# Patient Record
Sex: Male | Born: 1955 | Hispanic: Yes | Marital: Married | Smoking: Never smoker
Health system: Southern US, Community
[De-identification: ages and names within clinical notes are randomized; demographics above are authoritative.]

## PROBLEM LIST (undated history)

## (undated) DIAGNOSIS — H9190 Unspecified hearing loss, unspecified ear: Secondary | ICD-10-CM

## (undated) DIAGNOSIS — F1010 Alcohol abuse, uncomplicated: Secondary | ICD-10-CM

## (undated) DIAGNOSIS — R251 Tremor, unspecified: Secondary | ICD-10-CM

## (undated) HISTORY — PX: FRACTURE SURGERY: SHX138

---

## 2004-05-02 ENCOUNTER — Emergency Department: Payer: Self-pay | Admitting: Emergency Medicine

## 2013-02-17 ENCOUNTER — Inpatient Hospital Stay: Payer: Self-pay | Admitting: Orthopedic Surgery

## 2013-02-17 LAB — COMPREHENSIVE METABOLIC PANEL
Albumin: 4.6 g/dL (ref 3.4–5.0)
Alkaline Phosphatase: 99 U/L (ref 50–136)
Anion Gap: 9 (ref 7–16)
Bilirubin,Total: 1.3 mg/dL — ABNORMAL HIGH (ref 0.2–1.0)
Calcium, Total: 9.6 mg/dL (ref 8.5–10.1)
Creatinine: 0.78 mg/dL (ref 0.60–1.30)
Glucose: 160 mg/dL — ABNORMAL HIGH (ref 65–99)
Osmolality: 265 (ref 275–301)
Potassium: 3.5 mmol/L (ref 3.5–5.1)
SGOT(AST): 83 U/L — ABNORMAL HIGH (ref 15–37)
SGPT (ALT): 43 U/L (ref 12–78)
Total Protein: 8.7 g/dL — ABNORMAL HIGH (ref 6.4–8.2)

## 2013-02-17 LAB — CBC WITH DIFFERENTIAL/PLATELET
Basophil #: 0.1 10*3/uL (ref 0.0–0.1)
Basophil %: 0.7 %
Eosinophil %: 0 %
HGB: 12.1 g/dL — ABNORMAL LOW (ref 13.0–18.0)
Lymphocyte #: 0.9 10*3/uL — ABNORMAL LOW (ref 1.0–3.6)
Lymphocyte %: 9.4 %
MCH: 31.6 pg (ref 26.0–34.0)
MCHC: 34.5 g/dL (ref 32.0–36.0)
MCV: 92 fL (ref 80–100)
Monocyte %: 9.8 %
Neutrophil %: 80.1 %

## 2013-02-17 LAB — ETHANOL
Ethanol %: <0.003 % (ref 0.000–0.080)
Ethanol: <3 mg/dL

## 2013-02-17 LAB — PROTIME-INR: Prothrombin Time: 13.1 secs (ref 11.5–14.7)

## 2013-02-18 DIAGNOSIS — Z01810 Encounter for preprocedural cardiovascular examination: Secondary | ICD-10-CM

## 2013-02-18 LAB — CBC WITH DIFFERENTIAL/PLATELET
Basophil %: 0.4 %
Eosinophil #: 0 10*3/uL (ref 0.0–0.7)
Eosinophil %: 0 %
HGB: 11.3 g/dL — ABNORMAL LOW (ref 13.0–18.0)
Lymphocyte %: 9.8 %
MCH: 32.1 pg (ref 26.0–34.0)
MCHC: 34.8 g/dL (ref 32.0–36.0)
Monocyte #: 0.8 x10 3/mm (ref 0.2–1.0)
Monocyte %: 10.9 %
Neutrophil %: 78.9 %
RBC: 3.53 10*6/uL — ABNORMAL LOW (ref 4.40–5.90)
RDW: 13.9 % (ref 11.5–14.5)

## 2013-02-18 LAB — URINALYSIS, COMPLETE
Bacteria: NONE SEEN
Bilirubin,UR: NEGATIVE
Glucose,UR: 50 mg/dL (ref 0–75)
Hyaline Cast: 5
Leukocyte Esterase: NEGATIVE
Protein: 100
RBC,UR: 2 /HPF (ref 0–5)
Specific Gravity: 1.021 (ref 1.003–1.030)
Squamous Epithelial: NONE SEEN
Transitional Epi: <1
WBC UR: 4 /HPF (ref 0–5)

## 2013-02-18 LAB — BASIC METABOLIC PANEL
Anion Gap: 5 — ABNORMAL LOW (ref 7–16)
BUN: 16 mg/dL (ref 7–18)
Calcium, Total: 8.9 mg/dL (ref 8.5–10.1)
Co2: 29 mmol/L (ref 21–32)
EGFR (African American): >60
Osmolality: 269 (ref 275–301)
Potassium: 3.2 mmol/L — ABNORMAL LOW (ref 3.5–5.1)
Sodium: 133 mmol/L — ABNORMAL LOW (ref 136–145)

## 2013-02-18 LAB — MAGNESIUM: Magnesium: 1.4 mg/dL — ABNORMAL LOW

## 2013-02-19 LAB — HEMOGLOBIN: HGB: 10.1 g/dL — ABNORMAL LOW (ref 13.0–18.0)

## 2013-02-19 LAB — PLATELET COUNT: Platelet: 193 10*3/uL (ref 150–440)

## 2013-02-19 LAB — MAGNESIUM: Magnesium: 1.7 mg/dL — ABNORMAL LOW

## 2013-02-21 LAB — BASIC METABOLIC PANEL
Anion Gap: 5 — ABNORMAL LOW (ref 7–16)
BUN: 14 mg/dL (ref 7–18)
Calcium, Total: 8.6 mg/dL (ref 8.5–10.1)
Chloride: 96 mmol/L — ABNORMAL LOW (ref 98–107)
Co2: 30 mmol/L (ref 21–32)
Creatinine: 0.67 mg/dL (ref 0.60–1.30)
EGFR (African American): >60
Sodium: 131 mmol/L — ABNORMAL LOW (ref 136–145)

## 2013-07-04 ENCOUNTER — Emergency Department: Payer: Self-pay | Admitting: Emergency Medicine

## 2014-07-03 ENCOUNTER — Emergency Department
Admit: 2014-07-03 | Disposition: A | Discharge status: Home / Self Care | Payer: Self-pay | Admitting: Emergency Medicine

## 2014-07-03 LAB — COMPREHENSIVE METABOLIC PANEL
ALK PHOS: 97 U/L
ALT: 13 U/L — AB
Albumin: 4.7 g/dL
Anion Gap: 9 (ref 7–16)
BUN: 9 mg/dL
Bilirubin,Total: 0.6 mg/dL
CALCIUM: 9.7 mg/dL
Chloride: 97 mmol/L — ABNORMAL LOW
Co2: 29 mmol/L
Creatinine: 0.67 mg/dL
EGFR (African American): >60
EGFR (Non-African Amer.): >60
Glucose: 220 mg/dL — ABNORMAL HIGH
POTASSIUM: 4 mmol/L
SGOT(AST): 20 U/L
Sodium: 135 mmol/L
Total Protein: 8.2 g/dL — ABNORMAL HIGH

## 2014-07-03 LAB — CBC WITH DIFFERENTIAL/PLATELET
Basophil #: 0.1 10*3/uL (ref 0.0–0.1)
Basophil %: 0.7 %
EOS ABS: 0.1 10*3/uL (ref 0.0–0.7)
Eosinophil %: 1.3 %
HCT: 39.8 % — ABNORMAL LOW (ref 40.0–52.0)
HGB: 13.4 g/dL (ref 13.0–18.0)
LYMPHS PCT: 33.4 %
Lymphocyte #: 2.3 10*3/uL (ref 1.0–3.6)
MCH: 28.6 pg (ref 26.0–34.0)
MCHC: 33.6 g/dL (ref 32.0–36.0)
MCV: 85 fL (ref 80–100)
MONOS PCT: 6.7 %
Monocyte #: 0.5 x10 3/mm (ref 0.2–1.0)
NEUTROS PCT: 57.9 %
Neutrophil #: 4.1 10*3/uL (ref 1.4–6.5)
Platelet: 321 10*3/uL (ref 150–440)
RBC: 4.68 10*6/uL (ref 4.40–5.90)
RDW: 14.5 % (ref 11.5–14.5)
WBC: 7 10*3/uL (ref 3.8–10.6)

## 2014-07-03 LAB — URINALYSIS, COMPLETE
BACTERIA: NONE SEEN
BILIRUBIN, UR: NEGATIVE
Blood: NEGATIVE
Hyaline Cast: 2
KETONE: NEGATIVE
LEUKOCYTE ESTERASE: NEGATIVE
Nitrite: NEGATIVE
Ph: 6 (ref 4.5–8.0)
Protein: NEGATIVE
RBC,UR: NONE SEEN /HPF (ref 0–5)
SQUAMOUS EPITHELIAL: NONE SEEN
Specific Gravity: 1.007 (ref 1.003–1.030)
WBC UR: 3 /HPF (ref 0–5)

## 2014-07-03 LAB — TROPONIN I: Troponin-I: <0.03 ng/mL

## 2014-07-22 NOTE — H&P (Signed)
PATIENT NAME:  Geoffrey HickmanELLEZ, Mrk MR#:  [id number] DATE OF BIRTH:  07/11/1960  DATE OF ADMISSION:  02/17/2013  CHIEF COMPLAINT: Left hip pain and inability to walk.   HISTORY OF PRESENT ILLNESS: The patient is a 59 year old, who was intoxicated at his home the day prior to admission. He fell down the stairs and was unable to bear weight. He came into the Emergency Room with family on the day of admission. X-rays were obtained that showed a completely displaced femoral neck fracture and he is being admitted for treatment of this.   PAST MEDICAL HISTORY: Remarkable for no known drug allergies. No chronic medications. No prior surgeries.   SOCIAL HISTORY: He is a heavy drinker of 6 to 12 beers, probably daily according to his wife. He works as a Administratorlandscaper, currently unemployed and does not speak English. He is seen with the aid of an interpreter.   REVIEW OF SYSTEMS: Positive just for severe left hip pain. Denies prodromal symptoms.   PHYSICAL EXAMINATION:  GENERAL: Well-developed, well-nourished male, who appears his stated age, in a great deal of distress secondary to left hip pain.  HEENT: Remarkable for missing a few teeth, but otherwise intact. Normocephalic, atraumatic.  LUNGS: Clear.  HEART: Regular rate and rhythm.  ABDOMEN: Soft, nontender.  EXTREMITIES: Remarkable for holding the hip in a flexed position with external rotation. He has strong dorsalis pedis and posterior tib pulses. Skin is intact around the hip. There is no ecchymosis.   X-rays reveal a completely displaced femoral neck fracture.   CLINICAL IMPRESSION: Completely displaced femoral neck fracture in a young, active male.   RECOMMENDATION: Total hip replacement. I think endoprosthesis is likely to give significant groin pain with his physical demands, I discussed anterior approach and possibility of some lateral femoral cutaneous numbness, infection and blood clot in particular and the need to protect the hip postoperatively.  Medical consult was placed to help with his potential alcohol withdrawal. Plan on surgery later today. Again, the patient seen with interpreter including discussion of surgical procedure.   ____________________________ Leitha SchullerMichael J. Andrei Mccook, MD mjm:aw D: 02/18/2013 07:39:59 ET T: 02/18/2013 07:51:27 ET JOB#: [id number]  cc: Leitha SchullerMichael J. Manette Doto, MD, <Dictator> Leitha SchullerMICHAEL J Ameli Sangiovanni MD ELECTRONICALLY SIGNED 02/18/2013 10:23

## 2014-07-22 NOTE — Consult Note (Signed)
PATIENT NAME:  Geoffrey Solis, Geoffrey Solis MR#:  [id number] DATE OF BIRTH:  07/11/1960  DATE OF CONSULTATION:  02/18/2013  REFERRING PHYSICIAN:  Dr. Rosita KeaMenz.  CONSULTING PHYSICIAN:  Krystal EatonShayiq Sandra Tellefsen, MD  PRIMARY CARE PHYSICIAN: None.   REASON FOR CONSULTATION: Help with medical management and preop evaluation.   HISTORY OF PRESENT ILLNESS: The patient is a pleasant, 59 year old, Hispanic male with no significant past medical history, on no medications. Per him, he drinks about 6 to 12 cans of beer on the weekends but per interpreter who is interpreting for me who had also discussed with the wife, she had told the interpreter previously that the patient is a daily drinker. Apparently, the patient had been drinking and was going down in the basement while intoxicated and fell down about 10 steps and had hip pain. Was noted to have a hip fracture on the left. The patient is currently admitted to orthopedics, and medicine was consulted for preop evaluation and medical management.   The patient states that he is usually active. He works in [employer] but about 3 weeks ago, his job stopped. He does not have any history of MI or CHF. He has no chest pain while exerting himself.   PAST MEDICAL HISTORY: Denies.   SURGERIES: Denies.   ALLERGIES: Denies.   OUTPATIENT MEDICATIONS: Denies.   FAMILY HISTORY: Denies any history of CHF, MI, CAD or diabetes.   SOCIAL HISTORY: Positive for alcohol as above. Denied smoking. Denies any other drug use. Profession is Aeronautical engineerlandscaping.   REVIEW OF SYSTEMS:   CONSTITUTIONAL: Positive for about 11-pound weight loss unintentional. No fevers, chills, night sweats. Decreased vision for about 10 years.  ENT: No tinnitus, hearing loss or sore throat.  CARDIOVASCULAR: No chest pain, palpitations, MI or history of CHF.   RESPIRATORY: No cough, wheezing, shortness of breath or dyspnea on exertion.  GASTROINTESTINAL: No nausea, vomiting, diarrhea, abdominal pain, black stools or  dark stools or bloody stools.  GENITOURINARY: Denies dysuria, hematuria.  HEMATOLOGIC AND LYMPHATIC: Denies anemia or easy bruising.  SKIN: No rashes.  MUSCULOSKELETAL: Has hip pain.  PSYCHIATRIC: No anxiety or depression.  ENDOCRINE: Denies polyuria, nocturia or thyroid problems.  NEUROLOGIC: Denies any stroke, seizures or headaches.   PHYSICAL EXAMINATION:  VITAL SIGNS: Temperature on arrival noted to be 100.1, pulse rate 102, respiratory rate 18, blood pressure 157/82. O2 sat 95% on room air.  GENERAL: The patient is a well-developed male sitting in bed, no obvious distress, talking in full sentences.  HEENT: Normocephalic, atraumatic. Pupils appear to be equal and reactive. Extraocular muscles intact. Moist mucous membranes.  NECK: Supple. No thyroid tenderness. No cervical lymphadenopathy.  CARDIOVASCULAR: S1, S2, tachycardic. No significant murmurs, rubs or gallops.  LUNGS: Clear to auscultation without wheezing, rhonchi or rales.  ABDOMEN: Soft, nontender, nondistended. Positive bowel sounds in all quadrants.  EXTREMITIES: No pitting edema.  NEUROLOGIC: Cranial nerves II through XII grossly intact. Strength is 5 out of 5 upper extremities. Did not test lower extremities secondary to elicitation of pain with movement of the left lower extremity. The patient is slightly tremulous.  PSYCHIATRIC: The patient is awake, alert, oriented x 2 to 3. The patient knows the date. Oriented to place, person and almost date except that he thinks this is 2015.  SKIN: Multiple tattoos without any obvious rashes.   LABORATORIES: Glucose 160, BUN 20, creatinine 0.78, sodium 129, potassium 3.5. Alcohol below detection. Bilirubin 1.3, AST 83, ALT 43. White count 9.5, hemoglobin 12.1, platelets are 253. INR is  1. EKG shows normal sinus rhythm, rate of 96. No acute ST changes. Incomplete right bundle. X-ray of left hip complete showing moderately displaced left femoral neck fracture, and x-ray of the chest, 1  view, showing tortuous thoracic aorta and thoracic spondylosis. Lungs clear.   ASSESSMENT AND PLAN: A 59 year old Hispanic male with no significant past medical history, on no medications who is admitted to orthopedics and medicine was consulted for preoperative evaluation. 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 He has greater than 4 metabolic equivalents and I think is an acceptable risk for the operating room. We may proceed to operating room without further cardiac testing. He is not on a beta blocker. In regards to his alcohol history, I would agree with [facility]drawal [facility] for Alcohol protocol. He is tremulous but denies having any delirium tremens or withdrawals in the past. He appears to be at moderate to high risk for having some withdrawal symptoms. Postoperatively, I would consider standing doses of Librium if possible in addition to the [facility]al [facility] for Alcohol protocol. Would go ahead and check magnesium. He is on a banana bag already. This will continue. Pain and deep vein thrombosis prophylaxis per orthopedics. I would add a magnesium to the morning laboratories. He has slight hyponatremia. He has received some normal saline and will go ahead and recheck another BMP in the morning.   Thank you for this kind consult and will follow along with you.   TOTAL TIME SPENT: 45 minutes.   ____________________________ Krystal Eaton, MD sa:gb D: 02/18/2013 03:26:00 ET T: 02/18/2013 03:37:47 ET JOB#: [id number]  cc: Krystal Eaton, MD, <Dictator> Krystal Eaton MD ELECTRONICALLY SIGNED 03/08/2013 20:00

## 2014-07-22 NOTE — Consult Note (Signed)
Brief Consult Note: Diagnosis: hip fx.   Patient was seen by consultant.   Consult note dictated.   Recommend to proceed with surgery or procedure.   Recommend further assessment or treatment.   Orders entered.   Discussed with Attending MD.   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 drinks per him on weekends 6-12beers daily but per wife daily. no hx of dt, withdrawls per pt. s/p fall down steps while intoxicated and had hip fx.  admited by ortho.  no hx of chf,mi. no cp. active at baseline w/t angina. METS>4. proceed to OR w/t further cardiac testing and pt with acceptable risk. agree with ciwa. some tremors.  moniter.  check mag.  once able to take po consider standing tapered librium. check mag.  Electronic Signatures: Krystal EatonAhmadzia, Parish Dubose (MD)  (Signed [phone]-Nov-14 03:17)  Authored: Brief Consult Note   Last Updated: 20-Nov-14 03:17 by Krystal EatonAhmadzia, Dea Bitting (MD)

## 2014-07-22 NOTE — Discharge Summary (Signed)
PATIENT NAME:  Geoffrey Solis, Geoffrey Solis MR#:  [id number] DATE OF BIRTH:  07/11/1960  DATE OF ADMISSION:  02/17/2013 DATE OF DISCHARGE:  02/21/2013.   ADMITTING DIAGNOSIS: Left hip femoral neck fracture.   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   PROCEDURE: Left total hip replacement.   ANESTHESIA: General.   SURGEON: Leitha Schuller, M.D.   ESTIMATED BLOOD LOSS: 250 mL.   COMPLICATIONS: None.   SPECIMEN: Femoral head and neck.  IMPLANTS: Medacta AMIStem size 3 Versafit cup, DM 56 mm with appropriate liner and then S 28 mm head.   CONDITION: To recovery room, stable.   HISTORY: The patient is a 59 year old male who was intoxicated at his home on the day of admission. He fell down the stairs and was unable to unable to bear weight. He came to the Emergency Room with family on the day of admission. X-rays were obtained that showed a completely displaced femoral neck fracture, and he is being admitted for treatment of this.   PHYSICAL EXAMINATION: GENERAL: Well-developed, well-nourished male who appears his stated age, in a great deal of distress secondary to left hip pain.  HEENT: Remarkable for missing a few teeth but otherwise intact. Normocephalic, atraumatic.  LUNGS: Clear.  HEART: Regular rate and rhythm.  ABDOMEN: Soft, nontender.  EXTREMITIES: Remarkable for allowing the hip in a flexed position with external rotation. He has strong dorsalis pedis and posterior tibialis pulses. Skin is intact around the hip. There is no ecchymosis.   IMAGING: X-rays reveal completely displaced femoral neck fracture.   HOSPITAL COURSE: The patient was admitted to the hospital on 02/17/2013. He was admitted by orthopedics, and medicine was consulted for surgical clearance. The patient was found to be at low risk. He was placed on a CIWA protocol for his alcoholism. He was found to be hyponatremic at 129, and he was started on normal saline. On 02/18/2013, the patient had  surgery that day. He was brought to the orthopedic floor from the PACU in stable condition. The potassium and magnesium were found to be low, with potassium 3.2 and magnesium of 1.4. These were repleted. His sodium trended up to 133 after normal saline. The patient was found to be hypertensive so he was started on a beta blocker. On 02/19/2013, postoperative day 1, the patient's magnesium was 1.7 and potassium was 3.5. The patient did have a small drop in hemoglobin of 10.1. The patient progressed well with therapy. On postoperative day 2, the patient continued to progress very well with physical therapy. His vital signs and labs were stable. He ambulated 380 feet with a walker. He was doing well. On postoperative day 3, 02/21/2013, the patient continued to progress well with physical therapy. He was doing well, pain was controlled, and he was ready for discharge to home with home health PT.   CONDITION AT DISCHARGE: Stable.   DISCHARGE INSTRUCTIONS: The patient may gradually increase weight-bearing on the affected extremity. Thigh-high TED hose on both legs are removed at bedtime and replaced when arising the next morning. Elevate the heels off the bed. Use incentive spirometry every hour while awake. Encourage cough and deep breathing. He may resume a regular diet as tolerated. Apply an ice pack to the affected area. Do not get the dressing or bandage wet or dirty. Call [facility] if the dressing gets water under it. Leave the dressing on. Call [facility] if any of the following occur: Bright red bleeding from the incision wound, fever above 101.5  degrees, redness, swelling, or drainage at the incision site. Call [facility] if you experience any increased leg pain, numbness, weakness in your legs or bowel or bladder symptoms. Home physical therapy has been arranged for continuation of rehab program. Please call [facility] if a therapist has not contacted you within 48 hours of your return home.  Call [facility] for a follow-up appointment at [phone]. You need to be seen by [facility] in two weeks.   DISCHARGE MEDICATIONS: 1.  Oxycodone 5 mg 1 tablet orally every four hours as needed for pain. 2.  Vicodin 5/325, 1 tablet orally every six hours as needed for pain.  3.  Magnesium hydroxide 8% oral suspension 30 mL orally 2 times a day as needed for constipation. 4.  aspirin 325 mg 1 tab orally once a day  5.  Multivitamin 1 tablet orally once a day. 6.  Docusate/senna 50 mg/8.6 mg oral tablet 1 tablet orally 2 times a day.     ____________________________ T. Cranston Neighborhris Gaines, PA-C tcg:cg D: 02/20/2013 22:50:44 ET T: 02/20/2013 23:25:24 ET JOB#: [id number]  cc: T. Cranston Neighborhris Gaines, PA-C, <Dictator> Evon SlackHOMAS C GAINES GeorgiaPA ELECTRONICALLY SIGNED 02/22/2013 8:22

## 2014-07-22 NOTE — Op Note (Signed)
PATIENT NAME:  Geoffrey HickmanELLEZ, Abu MR#:  [id number] DATE OF BIRTH:  07/11/1960  DATE OF PROCEDURE:  02/18/2013  PREOPERATIVE DIAGNOSIS:  Left hip femoral neck fracture completely displaced.   POSTOPERATIVE DIAGNOSIS:  Left hip femoral neck fracture completely displaced with osteoarthritis.   PROCEDURE:  Left total hip replacement.   ANESTHESIA:  General.   SURGEON:  Leitha SchullerMichael J. Larrissa Stivers, MD   DESCRIPTION OF PROCEDURE:  The patient was brought to the operating room, and after adequate general anesthesia was obtained, he was placed on the operative table with the left leg in the Medacta attachment. The right leg was visualized and preop x-ray taken to restore anatomy to the left hip. After prepping and draping in the usual sterile fashion, appropriate patient identification, timeout procedures were completed. Direct anterior approach was made with incision centered over the greater trochanter and the tensor fascia lata muscle. The incision was carried down through the skin and subcutaneous tissue. The tensor fascia was incised and the muscle retracted laterally. The rectus fascia was then incised and the lateral femoral circumflex vessel cauterized. The anterior capsule was then exposed and a flap created. A femoral neck cut was carried out, removing the residual neck, and then the head was removed. There was significant bruising to the articular cartilage with some wear present. The labrum was excised and sequential reaming carried out to 56 mm. A 56 mm trial fit well and the 56 mm cup was impacted into place with fluoroscopic guidance. A Versafitcup DM 56 cup was placed. The leg was then externally rotated, pubofemoral and ischiofemoral releases carried out, and the leg brought into extension and adduction. Box osteotome followed by sequential broaching was performed. Trialing off the #2 showed that the stem could be slightly larger, and a 3 stem was placed with an S head. This restored anatomic length. Final  components were assembled with the #3 stem impacted down the canal. The bipolar head assembled with the appropriate liner for the 56 DM Versafitcup and an S-28 mm head. This was impacted onto the stem. The acetabulum was checked and was free of any debris. The hip was reduced and was stable. The wound was thoroughly irrigated and then closed with a heavy quill suture for the deep fascia. Then, 30 mL of 0.25% Sensorcaine with epinephrine was infiltrated in the area of soft tissue for postoperative analgesia. Subcutaneous Hemovac drain was placed followed by 2-0 quill subcutaneously and skin staples.   ESTIMATED BLOOD LOSS:  250.   COMPLICATIONS:  None.   SPECIMENS:  Femoral head and neck.   IMPLANTS:  Medacta AMIStem size 3, Versafitcup DM 56 mm with appropriate liner, and an S-28 mm head.   CONDITION TO RECOVERY ROOM:  Stable.    ____________________________ Leitha SchullerMichael J. Reaghan Kawa, MD mjm:ms D: 02/18/2013 17:32:52 ET T: 02/18/2013 22:20:13 ET JOB#: [id number]  cc: Leitha SchullerMichael J. Janicia Monterrosa, MD, <Dictator> Leitha SchullerMICHAEL J Jael Waldorf MD ELECTRONICALLY SIGNED 02/19/2013 13:04

## 2014-07-22 NOTE — Consult Note (Signed)
PATIENT NAME:  Geoffrey Solis, Mats MR#:  [id number] DATE OF BIRTH:  07/11/1960  DATE OF CONSULTATION:  02/17/2013  REFERRING PHYSICIAN:  Dr. Rosita KeaMenz.  CONSULTING PHYSICIAN:  Krystal EatonShayiq Sadye Kiernan, MD  PRIMARY CARE PHYSICIAN: None.   REASON FOR CONSULTATION: Help with medical management and preop evaluation.   HISTORY OF PRESENT ILLNESS: The patient is a pleasant, 59 year old, Hispanic male with no significant past medical history, on no medications. Per him, he drinks about 6 to 12 cans of beer on the weekends but per interpreter who is interpreting for me who had also discussed with the wife, she had told the interpreter previously that the patient is a daily drinker. Apparently, the patient had been drinking and was going down in the basement while intoxicated and fell down about 10 steps and had hip pain. Was noted to have a hip fracture on the left. The patient is currently admitted to orthopedics, and medicine was consulted for preop evaluation and medical management.   The patient states that he is usually active. He works in [employer] business but about 3 weeks ago, his job stopped. He does not have any history of MI or CHF. He has no chest pain while exerting himself.   PAST MEDICAL HISTORY: Denies.   SURGERIES: Denies.   ALLERGIES: Denies.   OUTPATIENT MEDICATIONS: Denies.   FAMILY HISTORY: Denies any history of CHF, MI, CAD or diabetes.   SOCIAL HISTORY: Positive for alcohol as above. Denied smoking. Denies any other drug use. Profession is Aeronautical engineerlandscaping.   REVIEW OF SYSTEMS:   CONSTITUTIONAL: Positive for about 11-pound weight loss unintentional. No fevers, chills, night sweats. Decreased vision for about 10 years.  ENT: No tinnitus, hearing loss or sore throat.  CARDIOVASCULAR: No chest pain, palpitations, MI or history of CHF.   RESPIRATORY: No cough, wheezing, shortness of breath or dyspnea on exertion.  GASTROINTESTINAL: No nausea, vomiting, diarrhea, abdominal pain, black stools or  dark stools or bloody stools.  GENITOURINARY: Denies dysuria, hematuria.  HEMATOLOGIC AND LYMPHATIC: Denies anemia or easy bruising.  SKIN: No rashes.  MUSCULOSKELETAL: Has hip pain.  PSYCHIATRIC: No anxiety or depression.  ENDOCRINE: Denies polyuria, nocturia or thyroid problems.  NEUROLOGIC: Denies any stroke, seizures or headaches.   PHYSICAL EXAMINATION:  VITAL SIGNS: Temperature on arrival noted to be 100.1, pulse rate 102, respiratory rate 18, blood pressure 157/82. O2 sat 95% on room air.  GENERAL: The patient is a well-developed male sitting in bed, no obvious distress, talking in full sentences.  HEENT: Normocephalic, atraumatic. Pupils appear to be equal and reactive. Extraocular muscles intact. Moist mucous membranes.  NECK: Supple. No thyroid tenderness. No cervical lymphadenopathy.  CARDIOVASCULAR: S1, S2, tachycardic. No significant murmurs, rubs or gallops.  LUNGS: Clear to auscultation without wheezing, rhonchi or rales.  ABDOMEN: Soft, nontender, nondistended. Positive bowel sounds in all quadrants.  EXTREMITIES: No pitting edema.  NEUROLOGIC: Cranial nerves II through XII grossly intact. Strength is 5 out of 5 upper extremities. Did not test lower extremities secondary to elicitation of pain with movement of the left lower extremity. The patient is slightly tremulous.  PSYCHIATRIC: The patient is awake, alert, oriented x 2 to 3. The patient knows the date. Oriented to place, person and almost date except that he thinks this is 2015.  SKIN: Multiple tattoos without any obvious rashes.   LABORATORIES: Glucose 160, BUN 20, creatinine 0.78, sodium 129, potassium 3.5. Alcohol below detection. Bilirubin 1.3, AST 83, ALT 43. White count 9.5, hemoglobin 12.1, platelets are 253. INR is  1. EKG shows normal sinus rhythm, rate of 96. No acute ST changes. Incomplete right bundle. X-ray of left hip complete showing moderately displaced left femoral neck fracture, and x-ray of the chest, 1  view, showing tortuous thoracic aorta and thoracic spondylosis. Lungs clear.   ASSESSMENT AND PLAN: A 59 year old Hispanic male with no significant past medical history, on no medications who is admitted to orthopedics and medicine was consulted for preoperative evaluation. 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 He has greater than 4 metabolic equivalents and I think is an acceptable risk for the operating room. We may proceed to operating room without further cardiac testing. He is not on a beta blocker. In regards to his alcohol history, I would agree with [facility] Withdrawal Assessment for Alcohol protocol. He is tremulous but denies having any delirium tremens or withdrawals in the past. He appears to be at moderate to high risk for having some withdrawal symptoms. Postoperatively, I would consider standing doses of Librium if possible in addition to the [facility] Withdrawal Assessment for Alcohol protocol. Would go ahead and check magnesium. He is on a banana bag already. This will continue. Pain and deep vein thrombosis prophylaxis per orthopedics. I would add a magnesium to the morning laboratories. He has slight hyponatremia. He has received some normal saline and will go ahead and recheck another BMP in the morning.   Thank you for this kind consult and will follow along with you.   TOTAL TIME SPENT: 45 minutes.   ____________________________ Krystal Eaton, MD sa:gb D: 02/18/2013 03:26:39 ET T: 02/18/2013 03:37:47 ET JOB#: [id number]  cc: Krystal Eaton, MD, <Dictator>

## 2016-09-20 ENCOUNTER — Emergency Department
Admission: EM | Admit: 2016-09-20 | Discharge: 2016-09-20 | Disposition: A | Discharge status: Home / Self Care | Payer: Self-pay | Attending: Emergency Medicine | Admitting: Emergency Medicine

## 2016-09-20 ENCOUNTER — Emergency Department: Payer: Self-pay

## 2016-09-20 DIAGNOSIS — E871 Hypo-osmolality and hyponatremia: Secondary | ICD-10-CM | POA: Insufficient documentation

## 2016-09-20 DIAGNOSIS — E1165 Type 2 diabetes mellitus with hyperglycemia: Secondary | ICD-10-CM | POA: Insufficient documentation

## 2016-09-20 DIAGNOSIS — E860 Dehydration: Secondary | ICD-10-CM | POA: Insufficient documentation

## 2016-09-20 LAB — URINALYSIS, COMPLETE (UACMP) WITH MICROSCOPIC
BILIRUBIN URINE: NEGATIVE
Bacteria, UA: NONE SEEN
Glucose, UA: >=500 mg/dL — AB
Hgb urine dipstick: NEGATIVE
Ketones, ur: NEGATIVE mg/dL
Leukocytes, UA: NEGATIVE
NITRITE: NEGATIVE
PH: 7 (ref 5.0–8.0)
Protein, ur: NEGATIVE mg/dL
RBC / HPF: NONE SEEN RBC/hpf (ref 0–5)
SPECIFIC GRAVITY, URINE: 1.002 — AB (ref 1.005–1.030)
Squamous Epithelial / LPF: NONE SEEN

## 2016-09-20 LAB — COMPREHENSIVE METABOLIC PANEL
ALBUMIN: 4.8 g/dL (ref 3.5–5.0)
ALK PHOS: 70 U/L (ref 38–126)
ALT: 82 U/L — ABNORMAL HIGH (ref 17–63)
AST: 95 U/L — AB (ref 15–41)
Anion gap: 12 (ref 5–15)
BUN: 26 mg/dL — AB (ref 6–20)
CALCIUM: 9.8 mg/dL (ref 8.9–10.3)
CO2: 25 mmol/L (ref 22–32)
Chloride: 88 mmol/L — ABNORMAL LOW (ref 101–111)
Creatinine, Ser: 1.19 mg/dL (ref 0.61–1.24)
GFR calc Af Amer: >60 mL/min (ref >60)
GFR calc non Af Amer: >60 mL/min (ref >60)
GLUCOSE: 305 mg/dL — AB (ref 65–99)
Potassium: 3.8 mmol/L (ref 3.5–5.1)
SODIUM: 125 mmol/L — AB (ref 135–145)
TOTAL PROTEIN: 8.1 g/dL (ref 6.5–8.1)
Total Bilirubin: 0.9 mg/dL (ref 0.3–1.2)

## 2016-09-20 LAB — CBC
HEMATOCRIT: 33.4 % — AB (ref 40.0–52.0)
HEMOGLOBIN: 11.7 g/dL — AB (ref 13.0–18.0)
MCH: 32.8 pg (ref 26.0–34.0)
MCHC: 35.2 g/dL (ref 32.0–36.0)
MCV: 93.4 fL (ref 80.0–100.0)
Platelets: 317 10*3/uL (ref 150–440)
RBC: 3.57 MIL/uL — ABNORMAL LOW (ref 4.40–5.90)
RDW: 13.8 % (ref 11.5–14.5)
WBC: 7.1 10*3/uL (ref 3.8–10.6)

## 2016-09-20 LAB — LIPASE, BLOOD: Lipase: 47 U/L (ref 11–51)

## 2016-09-20 MED ORDER — SODIUM CHLORIDE 0.9 % IV BOLUS (SEPSIS)
1000.0000 mL | Freq: Once | INTRAVENOUS | Status: AC
  Administered 2016-09-20: 1000 mL via INTRAVENOUS

## 2016-09-20 MED ORDER — FOLIC ACID 1 MG PO TABS
1.0000 mg | ORAL_TABLET | Freq: Once | ORAL | Status: AC
  Administered 2016-09-20: 1 mg via ORAL
  Filled 2016-09-20: qty 1

## 2016-09-20 MED ORDER — ONDANSETRON 4 MG PO TBDP: 4.0000 mg | ORAL_TABLET | Freq: Three times a day (TID) | ORAL | 0 refills | Status: DC | PRN

## 2016-09-20 MED ORDER — METFORMIN HCL 500 MG PO TABS: 500.0000 mg | ORAL_TABLET | Freq: Two times a day (BID) | ORAL | 0 refills | Status: DC

## 2016-09-20 MED ORDER — VITAMIN B-1 100 MG PO TABS
100.0000 mg | ORAL_TABLET | Freq: Once | ORAL | Status: AC
  Administered 2016-09-20: 100 mg via ORAL
  Filled 2016-09-20: qty 1

## 2016-09-20 MED ORDER — NAPROXEN 500 MG PO TABS
500.0000 mg | ORAL_TABLET | Freq: Once | ORAL | Status: AC
  Administered 2016-09-20: 500 mg via ORAL
  Filled 2016-09-20: qty 1

## 2016-09-20 NOTE — ED Triage Notes (Signed)
Pt states he slipped and fell 2 weeks ago and injured his left ribs, pt also c/o abd pain for several months and right sided head pain for the past 6 weeks.

## 2016-09-20 NOTE — Discharge Instructions (Signed)
Your blood sugar is significantly elevated, indicating diabetes. Take metformin twice a day as prescribed and follow-up with a primary care doctor such as the ones listed above for further monitoring of your condition. Avoid alcohol. Take ibuprofen or Aleve for your rib fracture pain.  Results for orders placed or performed during the hospital encounter of 09/20/16  Lipase, blood  Result Value Ref Range   Lipase 47 11 - 51 U/L  Comprehensive metabolic panel  Result Value Ref Range   Sodium 125 (L) 135 - 145 mmol/L   Potassium 3.8 3.5 - 5.1 mmol/L   Chloride 88 (L) 101 - 111 mmol/L   CO2 25 22 - 32 mmol/L   Glucose, Bld 305 (H) 65 - 99 mg/dL   BUN 26 (H) 6 - 20 mg/dL   Creatinine, Ser 4.541.19 0.61 - 1.24 mg/dL   Calcium 9.8 8.9 - 09.810.3 mg/dL   Total Protein 8.1 6.5 - 8.1 g/dL   Albumin 4.8 3.5 - 5.0 g/dL   AST 95 (H) 15 - 41 U/L   ALT 82 (H) 17 - 63 U/L   Alkaline Phosphatase 70 38 - 126 U/L   Total Bilirubin 0.9 0.3 - 1.2 mg/dL   GFR calc non Af Amer >60 >60 mL/min   GFR calc Af Amer >60 >60 mL/min   Anion gap 12 5 - 15  CBC  Result Value Ref Range   WBC 7.1 3.8 - 10.6 K/uL   RBC 3.57 (L) 4.40 - 5.90 MIL/uL   Hemoglobin 11.7 (L) 13.0 - 18.0 g/dL   HCT 11.933.4 (L) 14.740.0 - 82.952.0 %   MCV 93.4 80.0 - 100.0 fL   MCH 32.8 26.0 - 34.0 pg   MCHC 35.2 32.0 - 36.0 g/dL   RDW 56.213.8 13.011.5 - 86.514.5 %   Platelets 317 150 - 440 K/uL  Urinalysis, Complete w Microscopic  Result Value Ref Range   Color, Urine YELLOW (A) YELLOW   APPearance CLEAR (A) CLEAR   Specific Gravity, Urine 1.002 (L) 1.005 - 1.030   pH 7.0 5.0 - 8.0   Glucose, UA >=500 (A) NEGATIVE mg/dL   Hgb urine dipstick NEGATIVE NEGATIVE   Bilirubin Urine NEGATIVE NEGATIVE   Ketones, ur NEGATIVE NEGATIVE mg/dL   Protein, ur NEGATIVE NEGATIVE mg/dL   Nitrite NEGATIVE NEGATIVE   Leukocytes, UA NEGATIVE NEGATIVE   RBC / HPF NONE SEEN 0 - 5 RBC/hpf   WBC, UA 0-5 0 - 5 WBC/hpf   Bacteria, UA NONE SEEN NONE SEEN   Squamous Epithelial / LPF  NONE SEEN NONE SEEN   Dg Ribs Unilateral W/chest Left  Result Date: 09/20/2016 CLINICAL DATA:  Pain left anterior lower ribs. Pain marked with BB, but is in whole area around it. Fall two weeks ago, and has not gone away/ pain with deep breath. Can't sleep at night. Also having head pain as well. EXAM: LEFT RIBS AND CHEST - 3+ VIEW COMPARISON:  02/17/2013 FINDINGS: There are fractures, without significant displacement, of the anterolateral left eighth, ninth, tenth and eleventh ribs. No other fractures. The lungs are clear.  No pleural effusion or pneumothorax. Heart, mediastinum and hila are unremarkable. IMPRESSION: 1. Nondisplaced fractures of the anterolateral left eighth, ninth, tenth and eleventh ribs. No fracture complication. No acute cardiopulmonary disease. Electronically Signed   By: Amie Portlandavid  Ormond M.D.   On: 09/20/2016 17:59

## 2016-09-20 NOTE — ED Provider Notes (Signed)
[facility] Emergency Department Provider Note  ____________________________________________  Time seen: Approximately 8:26 PM  I have reviewed the triage vital signs and the nursing notes.   HISTORY  Chief Complaint Rib Injury; Headache; and Abdominal Pain  Encounter completed with Spanish interpreter at bedside  HPI Geoffrey Solis is a 61 y.o. male who complains of generalized headache, left-sided rib pain for the past 2 weeks. Triage note finds that the headache is been going on for 6 weeks. He also has chronic abdominal pain.  The rib pain is worse with movement and deep breathing. It hurts to press on that area. It started after he fell out of a chair. She reports having no medical problems and taking no medications. Denies fever chills or cough. Reports drinking daily but only 2-3 drinks a day. He says his last drink was a week ago. Denies any history of withdrawal symptoms.     History reviewed. No pertinent past medical history.   There are no active problems to display for this patient.    Past Surgical History:  Procedure Laterality Date  . FRACTURE SURGERY       Prior to Admission medications   Medication Sig Start Date End Date Taking? Authorizing Provider  metFORMIN (GLUCOPHAGE) 500 MG tablet Take 1 tablet (500 mg total) by mouth 2 (two) times daily with a meal. 09/20/16   Sharman CheekStafford, Taro Hidrogo, MD  ondansetron (ZOFRAN ODT) 4 MG disintegrating tablet Take 1 tablet (4 mg total) by mouth every 8 (eight) hours as needed for nausea or vomiting. 09/20/16   Sharman CheekStafford, Makarios Madlock, MD     Allergies Patient has no known allergies.   No family history on file.  Social History Social History  Substance Use Topics  . Smoking status: Never Smoker  . Smokeless tobacco: Never Used  . Alcohol use Yes  Daily alcohol, 3 drinks daily.  Review of Systems  Constitutional:   No fever or chills.  ENT:   No sore throat. No  rhinorrhea. Cardiovascular:   Positive left-sided chest wall pain. No syncope. Respiratory:   No dyspnea or cough. Gastrointestinal:   Positive chronic upper abdominal pain without vomiting and diarrhea.  Musculoskeletal:   Negative for focal pain or swelling All other systems reviewed and are negative except as documented above in ROS and HPI.  ____________________________________________   PHYSICAL EXAM:  VITAL SIGNS: ED Triage Vitals  Enc Vitals Group     BP 09/20/16 1658 (!) 150/95     Pulse Rate 09/20/16 1658 (!) 110     Resp 09/20/16 1658 18     Temp 09/20/16 1659 98.5 F (36.9 C)     Temp Source 09/20/16 1658 Oral     SpO2 09/20/16 1658 100 %     Weight 09/20/16 1700 146 lb (66.2 kg)     Height 09/20/16 1700 5\' 5"  (1.651 m)     Head Circumference --      Peak Flow --      Pain Score 09/20/16 1658 10     Pain Loc --      Pain Edu? --      Excl. in GC? --     Vital signs reviewed, nursing assessments reviewed.   Constitutional:   Alert and oriented. Well appearing and in no distress. Eyes:   No scleral icterus.  EOMI. No nystagmus. No conjunctival pallor. PERRL. ENT   Head:   Normocephalic and atraumatic.   Nose:   No congestion/rhinnorhea.    Mouth/Throat:  MMM, no pharyngeal erythema. No peritonsillar mass.    Neck:   No meningismus. Full ROM Hematological/Lymphatic/Immunilogical:   No cervical lymphadenopathy. Cardiovascular:   Tachycardia heart rate 110. Symmetric bilateral radial and DP pulses.  No murmurs.  Respiratory:   Normal respiratory effort without tachypnea/retractions. Breath sounds are clear and equal bilaterally. No wheezes/rales/rhonchi. Chest wall tenderness over the left inferolateral ribs consistent with nondisplaced rib fractures found on x-ray. No crepitus. No ecchymosis. Gastrointestinal:   Soft and nontender. Non distended. There is no CVA tenderness.  No rebound, rigidity, or guarding. Genitourinary:    deferred Musculoskeletal:   Normal range of motion in all extremities. No joint effusions.  No lower extremity tenderness.  No edema. Neurologic:   Normal speech and language.  Motor grossly intact. Normal gait No gross focal neurologic deficits are appreciated.  Skin:    Skin is warm, dry and intact. No rash noted.  No petechiae, purpura, or bullae.  ____________________________________________    LABS (pertinent positives/negatives) (all labs ordered are listed, but only abnormal results are displayed) Labs Reviewed  COMPREHENSIVE METABOLIC PANEL - Abnormal; Notable for the following:       Result Value   Sodium 125 (*)    Chloride 88 (*)    Glucose, Bld 305 (*)    BUN 26 (*)    AST 95 (*)    ALT 82 (*)    All other components within normal limits  CBC - Abnormal; Notable for the following:    RBC 3.57 (*)    Hemoglobin 11.7 (*)    HCT 33.4 (*)    All other components within normal limits  URINALYSIS, COMPLETE (UACMP) WITH MICROSCOPIC - Abnormal; Notable for the following:    Color, Urine YELLOW (*)    APPearance CLEAR (*)    Specific Gravity, Urine 1.002 (*)    Glucose, UA >=500 (*)    All other components within normal limits  LIPASE, BLOOD   ____________________________________________   EKG    ____________________________________________    RADIOLOGY  Dg Ribs Unilateral W/chest Left  Result Date: 09/20/2016 CLINICAL DATA:  Pain left anterior lower ribs. Pain marked with BB, but is in whole area around it. Fall two weeks ago, and has not gone away/ pain with deep breath. Can't sleep at night. Also having head pain as well. EXAM: LEFT RIBS AND CHEST - 3+ VIEW COMPARISON:  02/17/2013 FINDINGS: There are fractures, without significant displacement, of the anterolateral left eighth, ninth, tenth and eleventh ribs. No other fractures. The lungs are clear.  No pleural effusion or pneumothorax. Heart, mediastinum and hila are unremarkable. IMPRESSION: 1. Nondisplaced  fractures of the anterolateral left eighth, ninth, tenth and eleventh ribs. No fracture complication. No acute cardiopulmonary disease. Electronically Signed   By: Amie Portland M.D.   On: 09/20/2016 17:59    ____________________________________________   PROCEDURES Procedures  ____________________________________________   INITIAL IMPRESSION / ASSESSMENT AND PLAN / ED COURSE  Pertinent labs & imaging results that were available during my care of the patient were reviewed by me and considered in my medical decision making (see chart for details).  Patient presents with tachycardia hyperglycemia and mild hyponatremia, appears to be dehydrated secondary to undiagnosed diabetes and hyperglycemia. His daily alcohol intake is also contributing to dehydration. Does not appear to be in withdrawal at this time, not significantly tremulous or disoriented. Not flushed or diaphoretic or hot. Given the duration of his symptoms, a low suspicion for intracranial hemorrhage or subdural stroke meningitis encephalitis. He  is neurologically intact and no evidence of neurologic dysfunction from the hyponatremia. Patient is given IV fluids for hydration, prescription for metformin with instructions to follow up with primary care for continued management. No evidence of any acute infection or sepsis. With IV fluids tachycardia resolved. She is calm and comfortable and well-appearing.    ____________________________________________   FINAL CLINICAL IMPRESSION(S) / ED DIAGNOSES  Final diagnoses:  Type 2 diabetes mellitus with hyperglycemia, without long-term current use of insulin (HCC)  Dehydration  Hyponatremia      New Prescriptions   METFORMIN (GLUCOPHAGE) 500 MG TABLET    Take 1 tablet (500 mg total) by mouth 2 (two) times daily with a meal.   ONDANSETRON (ZOFRAN ODT) 4 MG DISINTEGRATING TABLET    Take 1 tablet (4 mg total) by mouth every 8 (eight) hours as needed for nausea or vomiting.      Portions of this note were generated with dragon dictation software. Dictation errors may occur despite best attempts at proofreading.    Sharman Cheek, MD 09/20/16 2030

## 2018-03-04 ENCOUNTER — Inpatient Hospital Stay: Payer: Self-pay

## 2018-03-04 ENCOUNTER — Emergency Department: Payer: Self-pay

## 2018-03-04 ENCOUNTER — Encounter: Payer: Self-pay | Admitting: Emergency Medicine

## 2018-03-04 ENCOUNTER — Other Ambulatory Visit: Payer: Self-pay

## 2018-03-04 ENCOUNTER — Inpatient Hospital Stay
Admission: EM | Admit: 2018-03-04 | Discharge: 2018-03-09 | DRG: 641 | Disposition: A | Discharge status: Home / Self Care | Payer: Self-pay | Attending: Internal Medicine | Admitting: Internal Medicine

## 2018-03-04 DIAGNOSIS — E876 Hypokalemia: Secondary | ICD-10-CM | POA: Diagnosis present

## 2018-03-04 DIAGNOSIS — Z7984 Long term (current) use of oral hypoglycemic drugs: Secondary | ICD-10-CM

## 2018-03-04 DIAGNOSIS — D649 Anemia, unspecified: Secondary | ICD-10-CM | POA: Diagnosis present

## 2018-03-04 DIAGNOSIS — Z23 Encounter for immunization: Secondary | ICD-10-CM

## 2018-03-04 DIAGNOSIS — D638 Anemia in other chronic diseases classified elsewhere: Secondary | ICD-10-CM | POA: Diagnosis present

## 2018-03-04 DIAGNOSIS — E871 Hypo-osmolality and hyponatremia: Principal | ICD-10-CM | POA: Diagnosis present

## 2018-03-04 DIAGNOSIS — E8729 Other acidosis: Secondary | ICD-10-CM

## 2018-03-04 DIAGNOSIS — Z96642 Presence of left artificial hip joint: Secondary | ICD-10-CM | POA: Diagnosis present

## 2018-03-04 DIAGNOSIS — M6282 Rhabdomyolysis: Secondary | ICD-10-CM | POA: Diagnosis present

## 2018-03-04 DIAGNOSIS — Z79899 Other long term (current) drug therapy: Secondary | ICD-10-CM

## 2018-03-04 DIAGNOSIS — F10231 Alcohol dependence with withdrawal delirium: Secondary | ICD-10-CM | POA: Diagnosis present

## 2018-03-04 DIAGNOSIS — W1830XA Fall on same level, unspecified, initial encounter: Secondary | ICD-10-CM | POA: Diagnosis present

## 2018-03-04 DIAGNOSIS — E119 Type 2 diabetes mellitus without complications: Secondary | ICD-10-CM | POA: Diagnosis present

## 2018-03-04 DIAGNOSIS — Z781 Physical restraint status: Secondary | ICD-10-CM

## 2018-03-04 DIAGNOSIS — R109 Unspecified abdominal pain: Secondary | ICD-10-CM

## 2018-03-04 DIAGNOSIS — E872 Acidosis: Secondary | ICD-10-CM | POA: Diagnosis present

## 2018-03-04 DIAGNOSIS — Z4659 Encounter for fitting and adjustment of other gastrointestinal appliance and device: Secondary | ICD-10-CM

## 2018-03-04 DIAGNOSIS — Z7141 Alcohol abuse counseling and surveillance of alcoholic: Secondary | ICD-10-CM

## 2018-03-04 LAB — COMPREHENSIVE METABOLIC PANEL
ALBUMIN: 4.6 g/dL (ref 3.5–5.0)
ALT: 44 U/L (ref 0–44)
AST: 89 U/L — AB (ref 15–41)
Alkaline Phosphatase: 58 U/L (ref 38–126)
Anion gap: 21 — ABNORMAL HIGH (ref 5–15)
BILIRUBIN TOTAL: 2.2 mg/dL — AB (ref 0.3–1.2)
BUN: 14 mg/dL (ref 8–23)
CALCIUM: 8.9 mg/dL (ref 8.9–10.3)
CO2: 18 mmol/L — ABNORMAL LOW (ref 22–32)
Chloride: 74 mmol/L — ABNORMAL LOW (ref 98–111)
Creatinine, Ser: 1.11 mg/dL (ref 0.61–1.24)
GFR calc Af Amer: >60 mL/min (ref >60)
GFR calc non Af Amer: >60 mL/min (ref >60)
GLUCOSE: 133 mg/dL — AB (ref 70–99)
Potassium: 3.4 mmol/L — ABNORMAL LOW (ref 3.5–5.1)
Sodium: 113 mmol/L — CL (ref 135–145)
TOTAL PROTEIN: 7.5 g/dL (ref 6.5–8.1)

## 2018-03-04 LAB — AMMONIA: Ammonia: 34 umol/L (ref 9–35)

## 2018-03-04 LAB — SODIUM
Sodium: 116 mmol/L — CL (ref 135–145)
Sodium: 121 mmol/L — ABNORMAL LOW (ref 135–145)
Sodium: 122 mmol/L — ABNORMAL LOW (ref 135–145)

## 2018-03-04 LAB — CBC WITH DIFFERENTIAL/PLATELET
ABS IMMATURE GRANULOCYTES: 0.14 10*3/uL — AB (ref 0.00–0.07)
BASOS ABS: 0 10*3/uL (ref 0.0–0.1)
Basophils Relative: 0 %
Eosinophils Absolute: 0 10*3/uL (ref 0.0–0.5)
Eosinophils Relative: 0 %
HEMATOCRIT: 30.1 % — AB (ref 39.0–52.0)
Hemoglobin: 11.1 g/dL — ABNORMAL LOW (ref 13.0–17.0)
IMMATURE GRANULOCYTES: 1 %
LYMPHS ABS: 0.9 10*3/uL (ref 0.7–4.0)
LYMPHS PCT: 7 %
MCH: 32 pg (ref 26.0–34.0)
MCHC: 36.9 g/dL — ABNORMAL HIGH (ref 30.0–36.0)
MCV: 86.7 fL (ref 80.0–100.0)
Monocytes Absolute: 0.8 10*3/uL (ref 0.1–1.0)
Monocytes Relative: 6 %
NEUTROS PCT: 86 %
NRBC: 0 % (ref 0.0–0.2)
Neutro Abs: 10.9 10*3/uL — ABNORMAL HIGH (ref 1.7–7.7)
Platelets: 284 10*3/uL (ref 150–400)
RBC: 3.47 MIL/uL — ABNORMAL LOW (ref 4.22–5.81)
RDW: 11.2 % — ABNORMAL LOW (ref 11.5–15.5)
WBC: 12.9 10*3/uL — ABNORMAL HIGH (ref 4.0–10.5)

## 2018-03-04 LAB — URINALYSIS, COMPLETE (UACMP) WITH MICROSCOPIC
BACTERIA UA: NONE SEEN
Bilirubin Urine: NEGATIVE
Glucose, UA: 50 mg/dL — AB
KETONES UR: 5 mg/dL — AB
LEUKOCYTES UA: NEGATIVE
Nitrite: NEGATIVE
PH: 6 (ref 5.0–8.0)
Protein, ur: NEGATIVE mg/dL
SPECIFIC GRAVITY, URINE: 1.004 — AB (ref 1.005–1.030)
SQUAMOUS EPITHELIAL / LPF: NONE SEEN (ref 0–5)

## 2018-03-04 LAB — PROTIME-INR
INR: 0.96
PROTHROMBIN TIME: 12.7 s (ref 11.4–15.2)

## 2018-03-04 LAB — TROPONIN I: Troponin I: <0.03 ng/mL (ref <0.03)

## 2018-03-04 LAB — OSMOLALITY: Osmolality: 244 mOsm/kg — CL (ref 275–295)

## 2018-03-04 LAB — GLUCOSE, CAPILLARY
Glucose-Capillary: 129 mg/dL — ABNORMAL HIGH (ref 70–99)
Glucose-Capillary: 99 mg/dL (ref 70–99)

## 2018-03-04 LAB — BETA-HYDROXYBUTYRIC ACID: Beta-Hydroxybutyric Acid: 2.28 mmol/L — ABNORMAL HIGH (ref 0.05–0.27)

## 2018-03-04 LAB — OSMOLALITY, URINE: Osmolality, Ur: 173 mOsm/kg — ABNORMAL LOW (ref 300–900)

## 2018-03-04 LAB — SODIUM, URINE, RANDOM: Sodium, Ur: 24 mmol/L

## 2018-03-04 LAB — CK: Total CK: 2429 U/L — ABNORMAL HIGH (ref 49–397)

## 2018-03-04 LAB — TSH: TSH: 4.809 u[IU]/mL — ABNORMAL HIGH (ref 0.350–4.500)

## 2018-03-04 LAB — MRSA PCR SCREENING: MRSA by PCR: NEGATIVE

## 2018-03-04 MED ORDER — SODIUM CHLORIDE 3 % IV SOLN
INTRAVENOUS | Status: DC
  Administered 2018-03-04: 30 mL/h via INTRAVENOUS
  Filled 2018-03-04 (×2): qty 500

## 2018-03-04 MED ORDER — ACETAMINOPHEN 325 MG PO TABS
650.0000 mg | ORAL_TABLET | Freq: Four times a day (QID) | ORAL | Status: DC | PRN
  Administered 2018-03-07 (×2): 650 mg via ORAL
  Filled 2018-03-04 (×2): qty 2

## 2018-03-04 MED ORDER — INSULIN ASPART 100 UNIT/ML ~~LOC~~ SOLN
0.0000 [IU] | Freq: Three times a day (TID) | SUBCUTANEOUS | Status: DC
  Administered 2018-03-05: 9 [IU] via SUBCUTANEOUS
  Administered 2018-03-06 (×2): 2 [IU] via SUBCUTANEOUS
  Administered 2018-03-07: 12:00:00 1 [IU] via SUBCUTANEOUS
  Administered 2018-03-07 (×2): 2 [IU] via SUBCUTANEOUS
  Administered 2018-03-08: 18:00:00 1 [IU] via SUBCUTANEOUS
  Administered 2018-03-08: 2 [IU] via SUBCUTANEOUS
  Administered 2018-03-08: 09:00:00 1 [IU] via SUBCUTANEOUS
  Administered 2018-03-09 (×2): 2 [IU] via SUBCUTANEOUS
  Filled 2018-03-04 (×11): qty 1

## 2018-03-04 MED ORDER — LORAZEPAM 2 MG/ML IJ SOLN
1.0000 mg | Freq: Once | INTRAMUSCULAR | Status: DC
  Filled 2018-03-04: qty 1

## 2018-03-04 MED ORDER — LORAZEPAM 2 MG PO TABS: 0.0000 mg | ORAL_TABLET | Freq: Two times a day (BID) | ORAL | Status: DC

## 2018-03-04 MED ORDER — LORAZEPAM 2 MG/ML IJ SOLN
0.0000 mg | Freq: Four times a day (QID) | INTRAMUSCULAR | Status: DC
  Administered 2018-03-04 (×2): 2 mg via INTRAVENOUS
  Filled 2018-03-04 (×3): qty 1

## 2018-03-04 MED ORDER — ADULT MULTIVITAMIN W/MINERALS CH
1.0000 | ORAL_TABLET | Freq: Every day | ORAL | Status: DC
  Administered 2018-03-05 – 2018-03-09 (×5): 1 via ORAL
  Filled 2018-03-04 (×5): qty 1

## 2018-03-04 MED ORDER — THIAMINE HCL 100 MG/ML IJ SOLN
100.0000 mg | Freq: Every day | INTRAMUSCULAR | Status: DC
  Filled 2018-03-04 (×3): qty 1

## 2018-03-04 MED ORDER — DEXMEDETOMIDINE HCL IN NACL 400 MCG/100ML IV SOLN
0.4000 ug/kg/h | INTRAVENOUS | Status: DC
  Administered 2018-03-04: 0.4 ug/kg/h via INTRAVENOUS
  Administered 2018-03-05: 0.5 ug/kg/h via INTRAVENOUS
  Administered 2018-03-05: 1.2 ug/kg/h via INTRAVENOUS
  Filled 2018-03-04 (×2): qty 100

## 2018-03-04 MED ORDER — LORAZEPAM 1 MG PO TABS
1.0000 mg | ORAL_TABLET | Freq: Four times a day (QID) | ORAL | Status: AC | PRN
  Administered 2018-03-07: 1 mg via ORAL

## 2018-03-04 MED ORDER — LORAZEPAM 2 MG/ML IJ SOLN
1.0000 mg | Freq: Once | INTRAMUSCULAR | Status: AC
  Administered 2018-03-04: 1 mg via INTRAVENOUS
  Filled 2018-03-04: qty 1

## 2018-03-04 MED ORDER — HYDROCODONE-ACETAMINOPHEN 5-325 MG PO TABS
1.0000 | ORAL_TABLET | ORAL | Status: DC | PRN
  Administered 2018-03-06: 1 via ORAL
  Filled 2018-03-04: qty 1

## 2018-03-04 MED ORDER — POLYETHYLENE GLYCOL 3350 17 G PO PACK: 17.0000 g | PACK | Freq: Every day | ORAL | Status: DC | PRN

## 2018-03-04 MED ORDER — MORPHINE SULFATE (PF) 4 MG/ML IV SOLN
4.0000 mg | Freq: Once | INTRAVENOUS | Status: AC
  Administered 2018-03-04: 4 mg via INTRAVENOUS

## 2018-03-04 MED ORDER — LORAZEPAM 2 MG PO TABS: 0.0000 mg | ORAL_TABLET | Freq: Four times a day (QID) | ORAL | Status: DC

## 2018-03-04 MED ORDER — VITAMIN B-1 100 MG PO TABS: 100.0000 mg | ORAL_TABLET | Freq: Every day | ORAL | Status: DC

## 2018-03-04 MED ORDER — VITAMIN B-1 100 MG PO TABS
100.0000 mg | ORAL_TABLET | Freq: Every day | ORAL | Status: DC
  Administered 2018-03-05 – 2018-03-09 (×5): 100 mg via ORAL
  Filled 2018-03-04 (×5): qty 1

## 2018-03-04 MED ORDER — ONDANSETRON HCL 4 MG PO TABS: 4.0000 mg | ORAL_TABLET | Freq: Four times a day (QID) | ORAL | Status: DC | PRN

## 2018-03-04 MED ORDER — SODIUM CHLORIDE 0.9 % IV SOLN
1000.0000 mL | Freq: Once | INTRAVENOUS | Status: AC
  Administered 2018-03-04: 1000 mL via INTRAVENOUS

## 2018-03-04 MED ORDER — KCL IN DEXTROSE-NACL 20-5-0.45 MEQ/L-%-% IV SOLN
INTRAVENOUS | Status: DC
  Administered 2018-03-04: 22:00:00 via INTRAVENOUS
  Filled 2018-03-04 (×2): qty 1000

## 2018-03-04 MED ORDER — POTASSIUM CHLORIDE IN NACL 20-0.9 MEQ/L-% IV SOLN
INTRAVENOUS | Status: DC
  Administered 2018-03-04: 22:00:00 via INTRAVENOUS
  Filled 2018-03-04 (×2): qty 1000

## 2018-03-04 MED ORDER — THIAMINE HCL 100 MG/ML IJ SOLN
100.0000 mg | Freq: Every day | INTRAMUSCULAR | Status: DC
  Administered 2018-03-04: 100 mg via INTRAVENOUS
  Filled 2018-03-04: qty 2

## 2018-03-04 MED ORDER — ENOXAPARIN SODIUM 40 MG/0.4ML ~~LOC~~ SOLN
40.0000 mg | SUBCUTANEOUS | Status: DC
  Administered 2018-03-04 – 2018-03-08 (×5): 40 mg via SUBCUTANEOUS
  Filled 2018-03-04 (×5): qty 0.4

## 2018-03-04 MED ORDER — ACETAMINOPHEN 650 MG RE SUPP: 650.0000 mg | Freq: Four times a day (QID) | RECTAL | Status: DC | PRN

## 2018-03-04 MED ORDER — LORAZEPAM 2 MG/ML IJ SOLN
0.0000 mg | Freq: Two times a day (BID) | INTRAMUSCULAR | Status: DC
  Administered 2018-03-04: 1 mg via INTRAVENOUS

## 2018-03-04 MED ORDER — LORAZEPAM 2 MG/ML IJ SOLN
1.0000 mg | Freq: Four times a day (QID) | INTRAMUSCULAR | Status: AC | PRN
  Filled 2018-03-04 (×2): qty 1

## 2018-03-04 MED ORDER — FOLIC ACID 1 MG PO TABS
1.0000 mg | ORAL_TABLET | Freq: Every day | ORAL | Status: DC
  Administered 2018-03-05 – 2018-03-09 (×5): 1 mg via ORAL
  Filled 2018-03-04 (×5): qty 1

## 2018-03-04 MED ORDER — LORAZEPAM 2 MG PO TABS
0.0000 mg | ORAL_TABLET | Freq: Two times a day (BID) | ORAL | Status: DC
  Filled 2018-03-04: qty 1

## 2018-03-04 MED ORDER — SODIUM CHLORIDE 0.9 % IV SOLN
Freq: Once | INTRAVENOUS | Status: AC
  Administered 2018-03-04: 1000 mL via INTRAVENOUS

## 2018-03-04 MED ORDER — ONDANSETRON HCL 4 MG/2ML IJ SOLN: 4.0000 mg | Freq: Four times a day (QID) | INTRAMUSCULAR | Status: DC | PRN

## 2018-03-04 MED ORDER — MORPHINE SULFATE (PF) 4 MG/ML IV SOLN
INTRAVENOUS | Status: AC
  Administered 2018-03-04: 4 mg via INTRAVENOUS
  Filled 2018-03-04: qty 1

## 2018-03-04 NOTE — ED Notes (Signed)
Patient transported to CT 

## 2018-03-04 NOTE — ED Provider Notes (Addendum)
[facility] Emergency Department Provider Note       Time seen: ----------------------------------------- 12:54 PM on 03/04/2018 ----------------------------------------- Level V caveat: History/ROS limited by altered mental status  I have reviewed the triage vital signs and the nursing notes.  HISTORY   Chief Complaint Shaking and Weakness   HPI Geoffrey Solis is a 62 y.o. male with no known past medical history who presents to the ED for weakness.  Patient was found on the floor in the basement by his family.  Patient does not remember what happened.  He is complaining of abdominal pain and pain to his head.  Reported possible history of cirrhosis but unknown.  He was brought in for possible sepsis and was noted to be tremulous in route.  No further information is available.  No past medical history on file.  There are no active problems to display for this patient.   Past Surgical History:  Procedure Laterality Date  . FRACTURE SURGERY      Allergies Patient has no known allergies.  Social History Social History   Tobacco Use  . Smoking status: Never Smoker  . Smokeless tobacco: Never Used  Substance Use Topics  . Alcohol use: Yes  . Drug use: No   Review of Systems Constitutional: Negative for fever. Cardiovascular: Negative for chest pain. Respiratory: Negative for shortness of breath. Gastrointestinal: Positive for abdominal pain Musculoskeletal: Negative for back pain. Skin: Negative for rash. Neurological: Positive for headache, positive for weakness  All systems negative/normal/unremarkable except as stated in the HPI  ____________________________________________   PHYSICAL EXAM:  VITAL SIGNS: ED Triage Vitals  Enc Vitals Group     BP      Pulse      Resp      Temp      Temp src      SpO2      Weight      Height      Head Circumference      Peak Flow      Pain Score      Pain Loc      Pain Edu?      Excl.  in GC?    Constitutional: Alert but disoriented.  Agitated and tremulous, mild distress Eyes: Conjunctivae are icteric. Normal extraocular movements. ENT   Head: Normocephalic and atraumatic.   Nose: No congestion/rhinnorhea.   Mouth/Throat: Mucous membranes are moist.   Neck: No stridor. Cardiovascular: Normal rate, regular rhythm. No murmurs, rubs, or gallops. Respiratory: Normal respiratory effort without tachypnea nor retractions. Breath sounds are clear and equal bilaterally. No wheezes/rales/rhonchi. Gastrointestinal: Soft and nontender. Normal bowel sounds Musculoskeletal: Nontender with normal range of motion in extremities. No lower extremity tenderness nor edema. Neurologic:  Normal speech and language. No gross focal neurologic deficits are appreciated.  Tremulousness diffusely Skin:  Skin is warm, dry and intact. No rash noted. Psychiatric: Mood and affect are normal. Speech and behavior are normal.  ____________________________________________  EKG: Interpreted by me.  Sinus tachycardia with rate of 115 bpm, RSR pattern, normal QT, normal axis, baseline artifact  ____________________________________________  ED COURSE:  As part of my medical decision making, I reviewed the following data within the electronic MEDICAL RECORD NUMBER History obtained from family if available, nursing notes, old chart and ekg, as well as notes from prior ED visits. Patient presented for weakness, we will assess with labs and imaging as indicated at this time. Clinical Course as of Mar 04 1446  Wed Mar 04, 2018  1307 Clinically patient is likely an alcohol withdrawal.  I will initiate CIWA protocol   [JW]  1426 Sodium(!!): 113 [JW]  1426 Anion gap(!): 21 [JW]  1447 Beta-Hydroxybutyric Acid(!): 2.28 [JW]    Clinical Course User Index [JW] Emily FilbertWilliams, Sidney Kann E, MD   Procedures ____________________________________________   LABS (pertinent positives/negatives)  Labs Reviewed   CBC WITH DIFFERENTIAL/PLATELET - Abnormal; Notable for the following components:      Result Value   WBC 12.9 (*)    RBC 3.47 (*)    Hemoglobin 11.1 (*)    HCT 30.1 (*)    MCHC 36.9 (*)    RDW 11.2 (*)    Neutro Abs 10.9 (*)    Abs Immature Granulocytes 0.14 (*)    All other components within normal limits  COMPREHENSIVE METABOLIC PANEL - Abnormal; Notable for the following components:   Sodium 113 (*)    Potassium 3.4 (*)    Chloride 74 (*)    CO2 18 (*)    Glucose, Bld 133 (*)    AST 89 (*)    Total Bilirubin 2.2 (*)    Anion gap 21 (*)    All other components within normal limits  BETA-HYDROXYBUTYRIC ACID - Abnormal; Notable for the following components:   Beta-Hydroxybutyric Acid 2.28 (*)    All other components within normal limits  BLOOD GAS, VENOUS - Abnormal; Notable for the following components:   pCO2, Ven 33 (*)    Acid-base deficit 3.6 (*)    All other components within normal limits  GLUCOSE, CAPILLARY - Abnormal; Notable for the following components:   Glucose-Capillary 129 (*)    All other components within normal limits  CULTURE, BLOOD (ROUTINE X 2)  CULTURE, BLOOD (ROUTINE X 2)  TROPONIN I  AMMONIA  PROTIME-INR  URINALYSIS, COMPLETE (UACMP) WITH MICROSCOPIC  CBG MONITORING, ED    RADIOLOGY Images were viewed by me  CT head IMPRESSION: No acute abnormality.  Left hip replacement IMPRESSION: 1. Age related involutional changes of the brain. 2. Likely chronic small vessel ischemic disease periventricular white matter with atherosclerosis at the skull base. 3. No acute intracranial abnormality. IMPRESSION: Mild increased prominence of the mediastinum may be technical in nature. Recommend a PA and lateral chest x-ray for better evaluation.  No other acute abnormalities. ____________________________________________  CRITICAL CARE Performed by: Ulice DashJohnathan E Rhiann Boucher   Total critical care time: 30 minutes  Critical care time was exclusive  of separately billable procedures and treating other patients.  Critical care was necessary to treat or prevent imminent or life-threatening deterioration.  Critical care was time spent personally by me on the following activities: development of treatment plan with patient and/or surrogate as well as nursing, discussions with consultants, evaluation of patient's response to treatment, examination of patient, obtaining history from patient or surrogate, ordering and performing treatments and interventions, ordering and review of laboratory studies, ordering and review of radiographic studies, pulse oximetry and re-evaluation of patient's condition.   DIFFERENTIAL DIAGNOSIS   Dehydration, electrolyte abnormality, alcohol withdrawal, DTs, sepsis  FINAL ASSESSMENT AND PLAN  Weakness, alcoholic ketoacidosis, hyponatremia   Plan: The patient had presented for weakness. Patient's labs did indicate likely alcoholic ketoacidosis with an elevated beta hydroxybutyric acid and elevated anion gap.  Patient was given IV fluids here which should correct some of this.  He is not currently vomiting but has had some diarrhea.  He is currently on CIWA protocol and has received IV Ativan.  Patient's imaging was within normal limits.  I will discuss with the hospitalist for admission.   Ulice Dash, MD   Note: This note was generated in part or whole with voice recognition software. Voice recognition is usually quite accurate but there are transcription errors that can and very often do occur. I apologize for any typographical errors that were not detected and corrected.     Emily Filbert, MD 03/04/18 1449    Emily Filbert, MD 03/04/18 [phone]

## 2018-03-04 NOTE — ED Notes (Signed)
Date and time results received: 03/04/18 4:29 PM   Test: sodium Critical Value: 116  Name of Provider Notified: Dr. Nita Sicklearolina Veronese

## 2018-03-04 NOTE — ED Notes (Signed)
Resumed care from [facility] rn.  Pt waiting on admission bed.  Pt has tremors.  Iv fluids infusing.  Sinus tach on monitor at 110.    siderails up x 2.

## 2018-03-04 NOTE — ED Notes (Signed)
Pt had diarrhea on himself in the bed. Cleaned by this RN. Pt does not ambulate well.

## 2018-03-04 NOTE — ED Notes (Signed)
Pt continues to wait on admission bed.  Pt alert.

## 2018-03-04 NOTE — ED Notes (Addendum)
Daughter at bedside endorses that patient drinks 3-4 40oz beer daily. Last drink was yesterday.

## 2018-03-04 NOTE — ED Notes (Signed)
meds given per ciwa scale

## 2018-03-04 NOTE — ED Triage Notes (Signed)
Patient arrives via [facility]. Patient's family found patient on the floor in the basement. Patient does not remember what happened. Complaining of abdominal pain and pain to left side of head.

## 2018-03-04 NOTE — ED Notes (Signed)
Pt with urinary incontinence.  Pt continues with tremors. meds given again for agitation and tremors.  Report called to [facility].

## 2018-03-04 NOTE — ED Notes (Signed)
Report to Amy, RN

## 2018-03-04 NOTE — ED Notes (Signed)
Date and time results received: 03/04/18 2:35 PM  (use smartphrase ".now" to insert current time)  Test: sodium Critical Value: 113  Name of Provider Notified: Dr. Daryel NovemberJonathan Williams

## 2018-03-04 NOTE — H&P (Signed)
[facility] [place] at [facility]   PATIENT NAME: Geoffrey Solis    MR#:  [id number]  DATE OF BIRTH:  September 03, 1955  DATE OF ADMISSION:  03/04/2018  PRIMARY CARE PHYSICIAN: Patient, No Pcp Per   REQUESTING/REFERRING PHYSICIAN: Dr Mayford Knife  CHIEF COMPLAINT:   Weakness and alcohol withdrawal HISTORY OF PRESENT ILLNESS:  Elyjah Hazan  is a 62 y.o. male with a known history of EtOH abuse who presents today to the emergency room due to generalized weakness, fall on the left hip and alcohol withdrawal. Patient states he had a mechanical fall and fell on the left hip.  He is complaining of 7 out of 10 pain in the left hip.  He was found on the floor in the basement by his family.  Patient's last drink of alcohol was yesterday.  He drinks 3 to 440 ounce beers a day.  He is noted to have a sodium level of 113 today. PAST MEDICAL HISTORY:  EtOH dependence  PAST SURGICAL HISTORY:   Past Surgical History:  Procedure Laterality Date  . FRACTURE SURGERY    Left hip surgery  SOCIAL HISTORY:   Social History   Tobacco Use  . Smoking status: Never Smoker  . Smokeless tobacco: Never Used  Substance Use Topics  . Alcohol use: Yes   Drinks 3 to 4, 40 ounce daily FAMILY HISTORY:  No history of CAD diabetes or hypertension  DRUG ALLERGIES:  No Known Allergies  REVIEW OF SYSTEMS:   Review of Systems  Constitutional: Positive for malaise/fatigue. Negative for chills and fever.  HENT: Negative.  Negative for ear discharge, ear pain, hearing loss, nosebleeds and sore throat.   Eyes: Negative.  Negative for blurred vision and pain.  Respiratory: Negative.  Negative for cough, hemoptysis, shortness of breath and wheezing.   Cardiovascular: Negative.  Negative for chest pain, palpitations and leg swelling.  Gastrointestinal: Negative.  Negative for abdominal pain, blood in stool, diarrhea, nausea and vomiting.  Genitourinary: Negative.  Negative for dysuria.   Musculoskeletal: Positive for falls. Negative for back pain.  Skin: Negative.   Neurological: Positive for dizziness and tremors. Negative for speech change, focal weakness, seizures and headaches.  Endo/Heme/Allergies: Negative.  Does not bruise/bleed easily.  Psychiatric/Behavioral: Negative.  Negative for depression, hallucinations and suicidal ideas.    MEDICATIONS AT HOME:   Prior to Admission medications   Medication Sig Start Date End Date Taking? Authorizing Provider  metFORMIN (GLUCOPHAGE) 500 MG tablet Take 1 tablet (500 mg total) by mouth 2 (two) times daily with a meal. Patient not taking: Reported on 03/04/2018 09/20/16   Sharman Cheek, MD  ondansetron (ZOFRAN ODT) 4 MG disintegrating tablet Take 1 tablet (4 mg total) by mouth every 8 (eight) hours as needed for nausea or vomiting. Patient not taking: Reported on 03/04/2018 09/20/16   Sharman Cheek, MD      VITAL SIGNS:  Blood pressure 95/64, pulse (!) 119, temperature 98.1 F (36.7 C), temperature source Oral, resp. rate (!) 28, height 5\' 4"  (1.626 m), weight 65 kg.  PHYSICAL EXAMINATION:   Physical Exam  Constitutional: He is oriented to person, place, and time. No distress.  Thin and frail tremulous  HENT:  Head: Normocephalic.  Eyes: No scleral icterus.  Neck: Normal range of motion. Neck supple. No JVD present. No tracheal deviation present.  Cardiovascular: Normal rate, regular rhythm and normal heart sounds. Exam reveals no gallop and no friction rub.  No murmur heard. Pulmonary/Chest: Effort normal and breath  sounds normal. No respiratory distress. He has no wheezes. He has no rales. He exhibits no tenderness.  Abdominal: Soft. Bowel sounds are normal. He exhibits no distension and no mass. There is no tenderness. There is no rebound and no guarding.  Musculoskeletal: He exhibits tenderness.  He has difficulty with range of motion of the left hip and pain when I move the left hip  Neurological: He is  alert and oriented to person, place, and time.  Patient is tremulous and shaky  Skin: Skin is warm. No rash noted. No erythema.  Psychiatric: Thought content normal.      LABORATORY PANEL:   CBC Recent Labs  Lab 03/04/18 1301  WBC 12.9*  HGB 11.1*  HCT 30.1*  PLT 284   ------------------------------------------------------------------------------------------------------------------  Chemistries  Recent Labs  Lab 03/04/18 1301  NA 113*  K 3.4*  CL 74*  CO2 18*  GLUCOSE 133*  BUN 14  CREATININE 1.11  CALCIUM 8.9  AST 89*  ALT 44  ALKPHOS 58  BILITOT 2.2*   ------------------------------------------------------------------------------------------------------------------  Cardiac Enzymes Recent Labs  Lab 03/04/18 1301  TROPONINI <0.03   ------------------------------------------------------------------------------------------------------------------  RADIOLOGY:  Dg Chest 1 View  Result Date: 03/04/2018 CLINICAL DATA:  Abdominal pain.  Patient found down. EXAM: CHEST  1 VIEW COMPARISON:  February 17, 2013 and July 03, 2014 FINDINGS: The mediastinum is more prominent the interval, possibly due to the low volume portable technique. The heart hila are normal. No pneumothorax. No other acute abnormalities. IMPRESSION: Mild increased prominence of the mediastinum may be technical in nature. Recommend a PA and lateral chest x-ray for better evaluation. No other acute abnormalities. Electronically Signed   By: Gerome Sam III M.D   On: 03/04/2018 13:44   Dg Pelvis 1-2 Views  Result Date: 03/04/2018 CLINICAL DATA:  Fall.  Left hip pain EXAM: PELVIS - 1-2 VIEW COMPARISON:  07/04/2013 FINDINGS: Left hip replacement in good position and unchanged. No acute fracture or bone lesion in the pelvis. IMPRESSION: No acute abnormality.  Left hip replacement Electronically Signed   By: Marlan Palau M.D.   On: 03/04/2018 14:15   Ct Head Wo Contrast  Result Date:  03/04/2018 CLINICAL DATA:  Patient found on floor by family. Pain on the left side of head. Altered level of consciousness. EXAM: CT HEAD WITHOUT CONTRAST TECHNIQUE: Contiguous axial images were obtained from the base of the skull through the vertex without intravenous contrast. COMPARISON:  Report from 02/12/1999 FINDINGS: Brain: Age related involutional changes of the brain with mild sulcal and ventricular prominence. Mild-to-moderate small vessel ischemic disease of periventricular white matter. No acute intracranial hemorrhage, midline shift or edema. No intra-axial mass nor extra-axial fluid. Patent basal cisterns. Brainstem and cerebellum appear intact without acute abnormality. Vascular: Atherosclerosis of carotid siphons. No hyperdense vessel sign. Skull: Intact Sinuses/Orbits: No acute finding. Other: Clear mastoids. IMPRESSION: 1. Age related involutional changes of the brain. 2. Likely chronic small vessel ischemic disease periventricular white matter with atherosclerosis at the skull base. 3. No acute intracranial abnormality. Electronically Signed   By: Tollie Eth M.D.   On: 03/04/2018 13:55    EKG:  Sinus tachycardia heart rate 150 no ST elevation or depression  IMPRESSION AND PLAN:   62 year old male with history of EtOH abuse who presents today to the emergency room due to pain in the left hip after a fall and generalized weakness.  1.  Severe hyponatremia and the setting of poor p.o. intake and chronic alcohol abuse: Case discussed with  Dr. Cherylann RatelLateef Start 3% saline at 30 cc an hour Sodium level every 4 hours Continue per 3% saline protocol  2.  EtOH ketoacidosis: Continue aggressive hydration  3.  Hypokalemia: Replete  4.  EtOH dependence: Patient counseled CIWA protocol initiated   5.  Left hip pain: Order CT Hip to rule out occult fracture   All the records are reviewed and case discussed with ED provider. Management plans discussed with the patient and family and they  are in agreement  CODE STATUS: FULL  CRITICAL CARE TOTAL TIME TAKING CARE OF THIS PATIENT: 55 minutes.    Cerenity Goshorn M.D on 03/04/2018 at 3:27 PM  Between 7am to 6pm - Pager - [phone]  After 6pm go to [email or web address] - password [email or web address]  [facility]  Office  [phone]  CC: Primary care physician; Patient, No Pcp Per

## 2018-03-04 NOTE — ED Notes (Signed)
Attempt to call report X 1 unsuccessful.  

## 2018-03-04 NOTE — Consult Note (Signed)
Name: Geoffrey Solis MRN: [id number] DOB: [birth year]    ADMISSION DATE:  03/04/2018 CONSULTATION DATE:  03/04/18  REFERRING MD :  Dr. Juliene Pina  CHIEF COMPLAINT:  Weakness, status post Fall, AMS  BRIEF PATIENT DESCRIPTION:  62 y.o. Male with Alcoholic Ketoacidosis, Severe Hyponatremia, Hypokalemia, and ETOH withdrawal / Delirium Tremens.  SIGNIFICANT EVENTS  03/04/18>> Admission to [facility] ICU  STUDIES:  CT Head wo contrast 03/04/18>> No acute intracranial abnormality. Likely chronic small vessel ischemic disease periventricular white matter with atherosclerosis at the skull base. DG Pelvis 03/04/18>> Left hip replacement in good position and unchanged. No acute fracture or bone lesion in the pelvis. CT Left Hip wo Contrast 03/04/18>> Intact left hip arthroplasty without acute soft tissue abnormalities noted.  CULTURES: Blood x2 03/04/18>>  ANTIBIOTICS:  HISTORY OF PRESENT ILLNESS:   Mr. Geoffrey Solis is a 62 y.o. Male with a PMH of left hip fracture and alcohol abuse, who presents to [facility] ED on 03/04/18 status post fall landing on his left hip and generalized weakness.  Pt is currently altered and confused, and no family is present, therefore history is obtained from ED and nursing notes.  It is reported that he was found on the floor by his family with no recollection of what happened, complaining of abdominal and head pain.  Pt was noted to be tremulous by EMS, and It is also reported that he had his last alcoholic drink yesterday on 03/03/18.  Initial workup in the ED revealed  Sodium 113, Potassium 3.4, Serum Bicarb 18, anion gap 21, Beta-Hydroxybutric acid 2.28,  WBC 12.9, and negative troponin.  Urinalysis was positive for ketones, and urine sodium 24.  CT head was negative for any acute process, Xray of the Pelvis was negative for any acute process, and showed good position of previous left hip replacement.  CT of the left hip noted no acute soft tissue abnormalities, and the previous left  hip arthroplasty was intact.  In the ED he received 2L normal saline boluses, and was placed on 3% Saline infusion.  He is admitted to [facility] ICU for treatment of Severe Hyponatremia in setting chronic alcohol use and poor PO intake, Alcoholic Ketoacidosis, and Alcohol withdrawal/Delirium Tremens.  PCCM is consulted for further management.  PAST MEDICAL HISTORY :   has no past medical history on file.  has a past surgical history that includes Fracture surgery. Prior to Admission medications   Medication Sig Start Date End Date Taking? Authorizing Provider  metFORMIN (GLUCOPHAGE) 500 MG tablet Take 1 tablet (500 mg total) by mouth 2 (two) times daily with a meal. Patient not taking: Reported on 03/04/2018 09/20/16   Sharman Cheek, MD  ondansetron (ZOFRAN ODT) 4 MG disintegrating tablet Take 1 tablet (4 mg total) by mouth every 8 (eight) hours as needed for nausea or vomiting. Patient not taking: Reported on 03/04/2018 09/20/16   Sharman Cheek, MD   No Known Allergies  FAMILY HISTORY:  family history is not on file. SOCIAL HISTORY:  reports that he has never smoked. He has never used smokeless tobacco. He reports that he drinks alcohol. He reports that he does not use drugs.  REVIEW OF SYSTEMS:  Positives in BOLD: Pt denies all complaints Constitutional: Negative for fever, chills, weight loss, malaise/fatigue and diaphoresis.  HENT: Negative for hearing loss, ear pain, nosebleeds, congestion, sore throat, neck pain, tinnitus and ear discharge.   Eyes: Negative for blurred vision, double vision, photophobia, pain, discharge and redness.  Respiratory: Negative for cough, hemoptysis,  sputum production, shortness of breath, wheezing and stridor.   Cardiovascular: Negative for chest pain, palpitations, orthopnea, claudication, leg swelling and PND.  Gastrointestinal: Negative for heartburn, nausea, vomiting, abdominal pain, diarrhea, constipation, blood in stool and melena.  Genitourinary:  Negative for dysuria, urgency, frequency, hematuria and flank pain.  Musculoskeletal: Negative for myalgias, back pain, joint pain and falls.  Skin: Negative for itching and rash.  Neurological: Negative for dizziness, tingling, tremors, sensory change, speech change, focal weakness, seizures, loss of consciousness, weakness and headaches.  Endo/Heme/Allergies: Negative for environmental allergies and polydipsia. Does not bruise/bleed easily.  SUBJECTIVE:  Denies chest pain and shortness of breath Has no complaints, reports "I am feeling well" On room air Vital signs stable   VITAL SIGNS: Temp:  [98.1 F (36.7 C)-98.3 F (36.8 C)] 98.3 F (36.8 C) (12/04 2147) Pulse Rate:  [95-134] 112 (12/04 2112) Resp:  [14-28] 17 (12/04 2030) BP: (95-153)/(64-95) 136/74 (12/04 2147) SpO2:  [97 %-100 %] 97 % (12/04 2030) Weight:  [65 kg-66.1 kg] 66.1 kg (12/04 2147)  PHYSICAL EXAMINATION: General:  Acutely ill appearing male, laying in bed, on room air, in NAD Neuro:  Awake, confused, oriented only to person, follows commands, no focal deficits, slight tremors noted HEENT:  Atraumatic, normocephalic, neck supple, no JVD Cardiovascular:  RRR, s1s2,no M/R/G, 2+ pulses throughout Lungs:  Clear to ausculation bilaterally, no wheezing, even, nonlabored, normal effort Abdomen:  Soft, nondistended, nontender, BS+ x4 Musculoskeletal:  Normal bulk and tone, no deformities, no edema Skin:  Warm,dry.  No obvious rashes, lesions, or ulcerations  Recent Labs  Lab 03/04/18 1301 03/04/18 1606 03/04/18 1702  NA 113* 116* 121*  K 3.4*  --   --   CL 74*  --   --   CO2 18*  --   --   BUN 14  --   --   CREATININE 1.11  --   --   GLUCOSE 133*  --   --    Recent Labs  Lab 03/04/18 1301  HGB 11.1*  HCT 30.1*  WBC 12.9*  PLT 284   Dg Chest 1 View  Result Date: 03/04/2018 CLINICAL DATA:  Abdominal pain.  Patient found down. EXAM: CHEST  1 VIEW COMPARISON:  February 17, 2013 and July 03, 2014  FINDINGS: The mediastinum is more prominent the interval, possibly due to the low volume portable technique. The heart hila are normal. No pneumothorax. No other acute abnormalities. IMPRESSION: Mild increased prominence of the mediastinum may be technical in nature. Recommend a PA and lateral chest x-ray for better evaluation. No other acute abnormalities. Electronically Signed   By: Gerome Sam III M.D   On: 03/04/2018 13:44   Dg Pelvis 1-2 Views  Result Date: 03/04/2018 CLINICAL DATA:  Fall.  Left hip pain EXAM: PELVIS - 1-2 VIEW COMPARISON:  07/04/2013 FINDINGS: Left hip replacement in good position and unchanged. No acute fracture or bone lesion in the pelvis. IMPRESSION: No acute abnormality.  Left hip replacement Electronically Signed   By: Marlan Palau M.D.   On: 03/04/2018 14:15   Ct Head Wo Contrast  Result Date: 03/04/2018 CLINICAL DATA:  Patient found on floor by family. Pain on the left side of head. Altered level of consciousness. EXAM: CT HEAD WITHOUT CONTRAST TECHNIQUE: Contiguous axial images were obtained from the base of the skull through the vertex without intravenous contrast. COMPARISON:  Report from 02/12/1999 FINDINGS: Brain: Age related involutional changes of the brain with mild sulcal and ventricular prominence. Mild-to-moderate small  vessel ischemic disease of periventricular white matter. No acute intracranial hemorrhage, midline shift or edema. No intra-axial mass nor extra-axial fluid. Patent basal cisterns. Brainstem and cerebellum appear intact without acute abnormality. Vascular: Atherosclerosis of carotid siphons. No hyperdense vessel sign. Skull: Intact Sinuses/Orbits: No acute finding. Other: Clear mastoids. IMPRESSION: 1. Age related involutional changes of the brain. 2. Likely chronic small vessel ischemic disease periventricular white matter with atherosclerosis at the skull base. 3. No acute intracranial abnormality. Electronically Signed   By: Tollie Ethavid  Kwon M.D.    On: 03/04/2018 13:55   Ct Hip Left Wo Contrast  Result Date: 03/04/2018 CLINICAL DATA:  Left hip pain after being found on floor. Negative x-rays earlier today. EXAM: CT OF THE LEFT HIP WITHOUT CONTRAST TECHNIQUE: Multidetector CT imaging of the left hip was performed according to the standard protocol. Multiplanar CT image reconstructions were also generated. COMPARISON:  Same day pelvic radiographs FINDINGS: Bones/Joint/Cartilage Intact uncemented left hip arthroplasty without periprosthetic fracture or loosening. No hardware failure. Ligaments Suboptimally assessed by CT. Muscles and Tendons Negative for intramuscular hemorrhage, edema or tear. Soft tissues Negative for hematoma or fluid collection.  No soft tissue mass. IMPRESSION: Intact left hip arthroplasty without acute soft tissue abnormalities noted. Electronically Signed   By: Tollie Ethavid  Kwon M.D.   On: 03/04/2018 16:47   [place] Ekg Site Rite  Result Date: 03/04/2018 If Site Rite image not attached, placement could not be confirmed due to current cardiac rhythm.   ASSESSMENT / PLAN:  Severe Hyponatremia in setting of chronic ETOH abuse and Poor PO intake -Serum Na q2h -DISCONTINUE 3% Saline infusion; initiate D5 1/2 NS w/ 20 K @ 75 ml/hr -Goal is  to increase serum Na by NO MORE THAN 8 mEq in 24 hr; goal correction rate of 0.5 mEq/ hr -High risk of overcorrection -Nephrology consulted, appreciate input  Anion gap Metabolic Acidosis in setting of Alcoholic Ketoacidosis -Thiamine & Folic acid -Aggressive IVF -Follow BMP  ETOH withdrawal & Delirium Tremens ETOH abuse -CIWA protocol -Prn Ativan -Precedex as needed -Encourage ETOH cessation  Mild Hypokalemia -Cardiac monitoring -Monitor I&O's / urinary output -Follow BMP -Ensure adequate renal perfusion -Avoid nephrotoxic agents as able -Replace electrolytes as indicated -Add K to Maintenance IVF    DISPOSITION: ICU GOALS OF CARE: Full code VTE PROPHYLAXIS:  Lovenox UPDATES: Pt is currently altered, no family at bedside during NP rounds.  Harlon DittyJeremiah Keene, AGACNP-BC [facility] Pager: [phone] Cell: [phone]  03/04/2018, 10:08 PM

## 2018-03-04 NOTE — Progress Notes (Signed)
eLink Physician-Brief Progress Note Patient Name: Geoffrey PoundsJuan Luciano Tellez DOB: 04/17/1955 MRN: [id number]   Date of Service  03/04/2018  HPI/Events of Note  Pt with history of ETOH abuse admitted with ETOH ketoacidosis, hyponatremia, altered mental status, and s/p fall. Pt likely going into ETOH withdrawal as well.  eICU Interventions  NP care plan reviewed. I agree with outlined plan of care.        Thomasene Lotkoronkwo U Ogan 03/04/2018, 10:14 PM

## 2018-03-04 NOTE — Procedures (Signed)
Central Venous Catheter Insertion Procedure Note Gwen PoundsJuan Luciano Tellez [id number] 09/03/1955  Procedure: Insertion of Central Venous Catheter Indications: Assessment of intravascular volume, Drug and/or fluid administration and Frequent blood sampling  Procedure Details Consent: Risks of procedure as well as the alternatives and risks of each were explained to the (patient/caregiver).  Consent for procedure obtained. Time Out: Verified patient identification, verified procedure, site/side was marked, verified correct patient position, special equipment/implants available, medications/allergies/relevent history reviewed, required imaging and test results available.  Performed  Maximum sterile technique was used including antiseptics, cap, gloves, gown, hand hygiene, mask and sheet. Skin prep: Chlorhexidine; local anesthetic administered A antimicrobial bonded/coated triple lumen catheter was placed in the right femoral vein due to emergent situation using the Seldinger technique.  Evaluation Blood flow good Complications: No apparent complications Patient did tolerate procedure well. Chest X-ray ordered to verify placement.  CXR: Not applicable, placed in right femoral vein   Ultrasound was used for direct visualization of cannulization of right femoral vein.   Harlon DittyJeremiah Karena Kinker, AGACNP-BC [facility] Pager: [phone] Cell: [phone]   Judithe ModestJeremiah D Lucresha Dismuke 03/04/2018, 11:57 PM

## 2018-03-05 ENCOUNTER — Inpatient Hospital Stay: Payer: Self-pay

## 2018-03-05 DIAGNOSIS — E876 Hypokalemia: Secondary | ICD-10-CM

## 2018-03-05 LAB — BASIC METABOLIC PANEL
Anion gap: 10 (ref 5–15)
Anion gap: 10 (ref 5–15)
BUN: 16 mg/dL (ref 8–23)
BUN: 14 mg/dL (ref 8–23)
CO2: 24 mmol/L (ref 22–32)
CO2: 24 mmol/L (ref 22–32)
Calcium: 8.6 mg/dL — ABNORMAL LOW (ref 8.9–10.3)
Calcium: 8.9 mg/dL (ref 8.9–10.3)
Chloride: 93 mmol/L — ABNORMAL LOW (ref 98–111)
Chloride: 95 mmol/L — ABNORMAL LOW (ref 98–111)
Creatinine, Ser: 0.79 mg/dL (ref 0.61–1.24)
Creatinine, Ser: 0.78 mg/dL (ref 0.61–1.24)
GFR calc Af Amer: >60 mL/min (ref >60)
GFR calc non Af Amer: >60 mL/min (ref >60)
GFR calc non Af Amer: >60 mL/min (ref >60)
Glucose, Bld: 189 mg/dL — ABNORMAL HIGH (ref 70–99)
Glucose, Bld: 66 mg/dL — ABNORMAL LOW (ref 70–99)
POTASSIUM: 2.9 mmol/L — AB (ref 3.5–5.1)
Potassium: 2.8 mmol/L — ABNORMAL LOW (ref 3.5–5.1)
Sodium: 127 mmol/L — ABNORMAL LOW (ref 135–145)
Sodium: 129 mmol/L — ABNORMAL LOW (ref 135–145)

## 2018-03-05 LAB — BLOOD GAS, VENOUS
Acid-base deficit: 3.6 mmol/L — ABNORMAL HIGH (ref 0.0–2.0)
Bicarbonate: 20.4 mmol/L (ref 20.0–28.0)
O2 Saturation: 52.8 %
PATIENT TEMPERATURE: 37
PCO2 VEN: 33 mmHg — AB (ref 44.0–60.0)
pH, Ven: 7.4 (ref 7.250–7.430)

## 2018-03-05 LAB — GLUCOSE, CAPILLARY
Glucose-Capillary: 362 mg/dL — ABNORMAL HIGH (ref 70–99)
Glucose-Capillary: 87 mg/dL (ref 70–99)
Glucose-Capillary: 79 mg/dL (ref 70–99)
Glucose-Capillary: 209 mg/dL — ABNORMAL HIGH (ref 70–99)

## 2018-03-05 LAB — CBC
HCT: 26.8 % — ABNORMAL LOW (ref 39.0–52.0)
HEMOGLOBIN: 9.6 g/dL — AB (ref 13.0–17.0)
MCH: 31.8 pg (ref 26.0–34.0)
MCHC: 35.8 g/dL (ref 30.0–36.0)
MCV: 88.7 fL (ref 80.0–100.0)
NRBC: 0 % (ref 0.0–0.2)
Platelets: 210 10*3/uL (ref 150–400)
RBC: 3.02 MIL/uL — ABNORMAL LOW (ref 4.22–5.81)
RDW: 11.2 % — ABNORMAL LOW (ref 11.5–15.5)
WBC: 4.9 10*3/uL (ref 4.0–10.5)

## 2018-03-05 LAB — AMYLASE: AMYLASE: 57 U/L (ref 28–100)

## 2018-03-05 LAB — SODIUM
SODIUM: 125 mmol/L — AB (ref 135–145)
SODIUM: 126 mmol/L — AB (ref 135–145)
Sodium: 122 mmol/L — ABNORMAL LOW (ref 135–145)
Sodium: 126 mmol/L — ABNORMAL LOW (ref 135–145)
Sodium: 129 mmol/L — ABNORMAL LOW (ref 135–145)
Sodium: 126 mmol/L — ABNORMAL LOW (ref 135–145)
Sodium: 121 mmol/L — ABNORMAL LOW (ref 135–145)
Sodium: 121 mmol/L — ABNORMAL LOW (ref 135–145)

## 2018-03-05 LAB — LIPASE, BLOOD: Lipase: 26 U/L (ref 11–51)

## 2018-03-05 LAB — T4, FREE: Free T4: 1.27 ng/dL (ref 0.82–1.77)

## 2018-03-05 LAB — POTASSIUM: Potassium: 3.6 mmol/L (ref 3.5–5.1)

## 2018-03-05 LAB — PHOSPHORUS: Phosphorus: 2.7 mg/dL (ref 2.5–4.6)

## 2018-03-05 LAB — BETA-HYDROXYBUTYRIC ACID: Beta-Hydroxybutyric Acid: 0.88 mmol/L — ABNORMAL HIGH (ref 0.05–0.27)

## 2018-03-05 LAB — MAGNESIUM: Magnesium: 2.1 mg/dL (ref 1.7–2.4)

## 2018-03-05 MED ORDER — POTASSIUM CHLORIDE 10 MEQ/100ML IV SOLN
10.0000 meq | INTRAVENOUS | Status: AC
  Administered 2018-03-05 (×4): 10 meq via INTRAVENOUS
  Filled 2018-03-05 (×4): qty 100

## 2018-03-05 MED ORDER — PHENOL 1.4 % MT LIQD
1.0000 | OROMUCOSAL | Status: DC | PRN
  Administered 2018-03-05: 1 via OROMUCOSAL
  Filled 2018-03-05: qty 177

## 2018-03-05 MED ORDER — SODIUM CHLORIDE 0.9% FLUSH
10.0000 mL | Freq: Two times a day (BID) | INTRAVENOUS | Status: DC
  Administered 2018-03-05: 10 mL
  Administered 2018-03-06: 21:00:00 30 mL
  Administered 2018-03-07: 11:00:00 10 mL
  Administered 2018-03-07: 40 mL
  Administered 2018-03-08 (×2): 10 mL

## 2018-03-05 MED ORDER — FLUMAZENIL 0.5 MG/5ML IV SOLN
INTRAVENOUS | Status: AC
  Administered 2018-03-05: 0.5 mg via INTRAVENOUS
  Filled 2018-03-05: qty 5

## 2018-03-05 MED ORDER — FREE WATER
300.0000 mL | Status: DC
  Administered 2018-03-05 (×4): 300 mL

## 2018-03-05 MED ORDER — LORAZEPAM 2 MG/ML IJ SOLN
2.0000 mg | Freq: Once | INTRAMUSCULAR | Status: AC
  Administered 2018-03-05: 2 mg via INTRAVENOUS

## 2018-03-05 MED ORDER — DEXTROSE 5 % IV SOLN
INTRAVENOUS | Status: DC
  Administered 2018-03-05: 05:00:00 via INTRAVENOUS

## 2018-03-05 MED ORDER — DEXTROSE 5 % IV SOLN
INTRAVENOUS | Status: DC
  Administered 2018-03-05: 06:00:00 via INTRAVENOUS

## 2018-03-05 MED ORDER — FLUMAZENIL 0.5 MG/5ML IV SOLN
0.5000 mg | Freq: Once | INTRAVENOUS | Status: AC
  Administered 2018-03-05: 0.5 mg via INTRAVENOUS

## 2018-03-05 MED ORDER — SODIUM CHLORIDE 0.9% FLUSH: 10.0000 mL | INTRAVENOUS | Status: DC | PRN

## 2018-03-05 NOTE — Progress Notes (Signed)
Pt's serum Na has increased to 126 from 122 in approximately 2 hrs, was previously holding at 122.  D5 1/2 NS discontinued, placed on D5 @ 150 ml/hr.  [facility].  After discussion with Dr. Warrick Parisiangan, will give 1L bolus of D5 over 2 hrs, place NGT with free water flushes 300 ml q4h.  If unable to place NGT and give free water flushes, will speak with Dr. Warrick Parisiangan again for possible Desmopressin administration.

## 2018-03-05 NOTE — Progress Notes (Signed)
Pharmacy Electrolyte Monitoring Consult:  Pharmacy consulted to assist in monitoring and replacing electrolytes in this 62 y.o. male admitted on 03/04/2018 with Shaking and Weakness s/t EtOH abuse and going through w/d and presenting to ED post fall.  Admission Na 113.  Labs:  Sodium (mmol/L)  Date Value  03/05/2018 126 (L)  07/03/2014 135   Potassium (mmol/L)  Date Value  03/05/2018 2.9 (L)  07/03/2014 4.0   Magnesium (mg/dL)  Date Value  96/29/528412/07/2017 2.1  02/21/2013 1.8   Phosphorus (mg/dL)  Date Value  13/24/401012/07/2017 2.7   Calcium (mg/dL)  Date Value  27/25/366412/07/2017 8.9   Calcium, Total (mg/dL)  Date Value  40/34/742504/05/2014 9.7   Albumin (g/dL)  Date Value  95/63/875612/06/2017 4.6  07/03/2014 4.7    Assessment/Plan: Na:  12/04 1300 113 12/04 1600 116 12/04 1700 121 12/04 2200 122 12/05 0100 122 12/05 0300 126 12/05 0500 127  3% saline was originally started @ 30 ml/hr, but Na has risen to more than 10 mEq/L over the past 12 hours and therefore saline has been stopped and D5 @ 150 ml/hr started s/t to CBG of 99 and possible malnutrition. Na checks q2h. Nephrology is following and adjusting Dextrose.  12/05 1200 K 2.9 -- pharmacy now consulted to manage electrolytes (nephro consulted for Na). Patient received KCl 10 mEq IV x 4. Will order KCl 10mEq IV x 4 and recheck K level at 23:00. Will continue to monitor q2h Na checks.  Clovia CuffLisa Kyira Volkert, PharmD, BCPS 03/05/2018 4:30 PM

## 2018-03-05 NOTE — Progress Notes (Signed)
MD Lateef notified of NA 129, new orders of D5 at 200/hr

## 2018-03-05 NOTE — Progress Notes (Signed)
[employer] [facility] [place] [facility]   PATIENT NAME: Geoffrey Solis    MR#:  [id number]  DATE OF BIRTH:  April 16, 1955  SUBJECTIVE:  CHIEF COMPLAINT:   Chief Complaint  Patient presents with  . Shaking  . Weakness  Patient seen today On IV Precedex drip for sedation Unable to get any history  REVIEW OF SYSTEMS:    ROS  Unable to obtain any history secondary to sedation on Precedex drip  DRUG ALLERGIES:  No Known Allergies  VITALS:  Blood pressure 119/70, pulse (!) 57, temperature 97.7 F (36.5 C), temperature source Oral, resp. rate 18, height 5\' 4"  (1.626 m), weight 66.1 kg, SpO2 100 %.  PHYSICAL EXAMINATION:   Physical Exam  GENERAL:  62 y.o.-year-old patient lying in the bed  EYES: Pupils equal, round, reactive to light and accommodation. No scleral icterus. Extraocular muscles intact.  HEENT: Head atraumatic, normocephalic. Oropharynx and nasopharynx clear.  NECK:  Supple, no jugular venous distention. No thyroid enlargement, no tenderness.  LUNGS: Normal breath sounds bilaterally, no wheezing, rales, rhonchi. No use of accessory muscles of respiration.  CARDIOVASCULAR: S1, S2 normal. No murmurs, rubs, or gallops.  ABDOMEN: Soft, nontender, nondistended. Bowel sounds present. No organomegaly or mass.  EXTREMITIES: No cyanosis, clubbing or edema b/l.    NEUROLOGIC: System exam not possible as patient is on sedation with Precedex drip PSYCHIATRIC: The patient is alert and oriented x none SKIN: No obvious rash, lesion, or ulcer.   LABORATORY PANEL:   CBC Recent Labs  Lab 03/05/18 0452  WBC 4.9  HGB 9.6*  HCT 26.8*  PLT 210   ------------------------------------------------------------------------------------------------------------------ Chemistries  Recent Labs  Lab 03/04/18 1301  03/05/18 0452  03/05/18 0945  NA 113*   < > 127*   < > 126*  K 3.4*  --  2.8*  --   --   CL 74*  --  93*  --   --   CO2 18*  --  24  --   --   GLUCOSE  133*  --  189*  --   --   BUN 14  --  16  --   --   CREATININE 1.11  --  0.79  --   --   CALCIUM 8.9  --  8.6*  --   --   MG  --   --   --   --  2.1  AST 89*  --   --   --   --   ALT 44  --   --   --   --   ALKPHOS 58  --   --   --   --   BILITOT 2.2*  --   --   --   --    < > = values in this interval not displayed.   ------------------------------------------------------------------------------------------------------------------  Cardiac Enzymes Recent Labs  Lab 03/04/18 1301  TROPONINI <0.03   ------------------------------------------------------------------------------------------------------------------  RADIOLOGY:  Dg Chest 1 View  Result Date: 03/04/2018 CLINICAL DATA:  Abdominal pain.  Patient found down. EXAM: CHEST  1 VIEW COMPARISON:  February 17, 2013 and July 03, 2014 FINDINGS: The mediastinum is more prominent the interval, possibly due to the low volume portable technique. The heart hila are normal. No pneumothorax. No other acute abnormalities. IMPRESSION: Mild increased prominence of the mediastinum may be technical in nature. Recommend a PA and lateral chest x-ray for better evaluation. No other acute abnormalities. Electronically Signed   By: Gerome Sam III  M.D   On: 03/04/2018 13:44   Dg Pelvis 1-2 Views  Result Date: 03/04/2018 CLINICAL DATA:  Fall.  Left hip pain EXAM: PELVIS - 1-2 VIEW COMPARISON:  07/04/2013 FINDINGS: Left hip replacement in good position and unchanged. No acute fracture or bone lesion in the pelvis. IMPRESSION: No acute abnormality.  Left hip replacement Electronically Signed   By: Marlan Palau M.D.   On: 03/04/2018 14:15   Dg Abd 1 View  Result Date: 03/05/2018 CLINICAL DATA:  62 year old male with NG tube placement. EXAM: ABDOMEN - 1 VIEW COMPARISON:  Abdominal radiograph dated 03/04/2018 FINDINGS: Partially visualized enteric tube with tip and side-port in the proximal stomach. Air-filled loops of small bowel noted. Degenerative  changes of the spine. IMPRESSION: Enteric tube with tip in the proximal stomach. Electronically Signed   By: Elgie Collard M.D.   On: 03/05/2018 06:38   Ct Head Wo Contrast  Result Date: 03/04/2018 CLINICAL DATA:  Patient found on floor by family. Pain on the left side of head. Altered level of consciousness. EXAM: CT HEAD WITHOUT CONTRAST TECHNIQUE: Contiguous axial images were obtained from the base of the skull through the vertex without intravenous contrast. COMPARISON:  Report from 02/12/1999 FINDINGS: Brain: Age related involutional changes of the brain with mild sulcal and ventricular prominence. Mild-to-moderate small vessel ischemic disease of periventricular white matter. No acute intracranial hemorrhage, midline shift or edema. No intra-axial mass nor extra-axial fluid. Patent basal cisterns. Brainstem and cerebellum appear intact without acute abnormality. Vascular: Atherosclerosis of carotid siphons. No hyperdense vessel sign. Skull: Intact Sinuses/Orbits: No acute finding. Other: Clear mastoids. IMPRESSION: 1. Age related involutional changes of the brain. 2. Likely chronic small vessel ischemic disease periventricular white matter with atherosclerosis at the skull base. 3. No acute intracranial abnormality. Electronically Signed   By: Tollie Eth M.D.   On: 03/04/2018 13:55   Ct Hip Left Wo Contrast  Result Date: 03/04/2018 CLINICAL DATA:  Left hip pain after being found on floor. Negative x-rays earlier today. EXAM: CT OF THE LEFT HIP WITHOUT CONTRAST TECHNIQUE: Multidetector CT imaging of the left hip was performed according to the standard protocol. Multiplanar CT image reconstructions were also generated. COMPARISON:  Same day pelvic radiographs FINDINGS: Bones/Joint/Cartilage Intact uncemented left hip arthroplasty without periprosthetic fracture or loosening. No hardware failure. Ligaments Suboptimally assessed by CT. Muscles and Tendons Negative for intramuscular hemorrhage, edema or  tear. Soft tissues Negative for hematoma or fluid collection.  No soft tissue mass. IMPRESSION: Intact left hip arthroplasty without acute soft tissue abnormalities noted. Electronically Signed   By: Tollie Eth M.D.   On: 03/04/2018 16:47   [place] Ekg Site Rite  Result Date: 03/04/2018 If Site Rite image not attached, placement could not be confirmed due to current cardiac rhythm.    ASSESSMENT AND PLAN:   62 year old male patient with history of Etha abuse and dependence currently in the ICU for electrolyte abnormality and mechanical fall  -Severe hyponatremia Improving Off 3% hypertonic saline Status post nephrology evaluation Probably secondary to alcohol abuse  -Alcohol withdrawal/call abuse On IV Precedex drip for sedation Continue CIWA protocol  -Alcoholic ketoacidosis IV fluid hydration  -Mechanical fall and left hip pain No evidence of fracture on CT hip Intact left hip arthroplasty  -Acute hypokalemia Replace potassium aggressively  All the records are reviewed and case discussed with Care Management/Social Worker. Management plans discussed with the patient, family and they are in agreement.  CODE STATUS: Full code  DVT Prophylaxis: SCDs  TOTAL  TIME TAKING CARE OF THIS PATIENT: 35 minutes.   POSSIBLE D/C IN 3 to 4 DAYS, DEPENDING ON CLINICAL CONDITION.  Ihor AustinPavan Pyreddy M.D on 03/05/2018 at 11:12 AM  Between 7am to 6pm - Pager - [phone]  After 6pm go to [email or web address] - password [email or web address]  [facility]  Office  [phone]  CC: Primary care physician; Patient, No Pcp Per  Note: This dictation was prepared with Dragon dictation along with smaller phrase technology. Any transcriptional errors that result from this process are unintentional.

## 2018-03-05 NOTE — Progress Notes (Addendum)
Pharmacy Electrolyte Monitoring Consult:  Pharmacy consulted to assist in monitoring and replacing electrolytes in this 62 y.o. male admitted on 03/04/2018 with Shaking and Weakness s/t EtOH abuse and going through w/d and presenting to ED post fall.  Admission Na 113.  Labs:  Sodium (mmol/L)  Date Value  03/05/2018 126 (L)  07/03/2014 135   Potassium (mmol/L)  Date Value  03/04/2018 3.4 (L)  07/03/2014 4.0   Magnesium (mg/dL)  Date Value  09/81/191411/23/2014 1.8   Calcium (mg/dL)  Date Value  78/29/562112/06/2017 8.9   Calcium, Total (mg/dL)  Date Value  30/86/578404/05/2014 9.7   Albumin (g/dL)  Date Value  69/62/952812/06/2017 4.6  07/03/2014 4.7    Assessment/Plan: Na:  12/04 1300 113 12/04 1600 116 12/04 1700 121 12/04 2200 122 12/05 0100 122 12/05 0300 126  3% saline was originally started @ 30 ml/hr, but Na has risen to more than 10 mEq/L over the past 12 hours and therefore saline has been stopped and D5 @ 150 ml/hr started s/t to CBG of 99 and possible malnutrition. Na checks q2h.  12/04 1300 K 3.4 -- pharmacy now consulted to manage electrolytes (nephro consulted for Na). Will replace K w/ KCl 10 mEq IV x 4 and will recheck electrolytes @ 1100 post K-runs. Will continue to monitor q2h Na checks.  Thomasene Rippleavid Yuvonne Lanahan, PharmD, BCPS Clinical Pharmacist 03/05/2018

## 2018-03-05 NOTE — Progress Notes (Addendum)
Inpatient Diabetes Program Recommendations  AACE/ADA: New Consensus Statement on Inpatient Glycemic Control (2019)  Target Ranges:  Prepandial:   less than 140 mg/dL      Peak postprandial:   less than 180 mg/dL (1-2 hours)      Critically ill patients:  140 - 180 mg/dL   Results for Geoffrey Solis, [name] (MRN [id number]) as of 03/05/2018 12:16  Ref. Range 03/04/2018 13:22 03/04/2018 21:44 03/05/2018 07:20 03/05/2018 11:20  Glucose-Capillary Latest Ref Range: 70 - 99 mg/dL 409129 (H) 99 811362 (H) 87   Review of Glycemic Control  Diabetes history: DM2 Outpatient Diabetes medications: Metformin 500 mg BID Current orders for Inpatient glycemic control: Novolog 0-9 units TID with meals  Inpatient Diabetes Program Recommendations:  Correction (SSI): Please consider ordering Novolog 0-5 units QHS for bedtime correction. Diet: Please consider changing diet to Carb Modified if appropriate. HbgA1C: Please consider ordering an A1C to evaluate glycemic control over the past 2-3 months.  Thanks, Orlando PennerMarie Mariann Palo, RN, MSN, CDE Diabetes Coordinator Inpatient Diabetes Program [phone] (Team Pager from 8am to 5pm)

## 2018-03-05 NOTE — Progress Notes (Signed)
PT Cancellation Note  Patient Details Name: Geoffrey PoundsJuan Luciano Solis MRN: [id number] DOB: 11/10/1955   Cancelled Treatment:    Reason Eval/Treat Not Completed: Medical issues which prohibited therapy(Low sodium and potassium readings).  Per protocol, will hold PT until pt more medically appropriate for exertional activity.    Encarnacion ChuAshley Maitland Muhlbauer PT, DPT 03/05/2018, 8:44 AM

## 2018-03-05 NOTE — Consult Note (Signed)
[facility] CONSULT NOTE    Date: 03/05/2018                  Patient Name:  Geoffrey Solis  MRN: [id number]  DOB: [birth year]  Age / Sex: 62 y.o., male         PCP: Patient, No Pcp Per                 Service Requesting Consult: Hospitalist                 Reason for Consult: Hyponatremia            History of Present Illness: Patient is a 62 y.o. male with a PMHx of alcohol abuse, who was admitted to [facility] on 03/04/2018 for evaluation of generalized weakness, fall, and suspected alcohol withdrawal.  Patient lethargic at the moment and unable to offer any history.  He is currently in soft restraints.  We are asked to see him for hyponatremia.  Upon initial presentation his serum sodium was quite low at 113 which was at 1 PM yesterday.  Serum sodium at the moment is 125.  He was placed on D5W given fast rise of serum sodium. He was administered a large bolus of D5W and will be placed on a drip of D5W at 150 cc/h.  Serum sodium being checked every 2 hours.  Medications: Outpatient medications: Medications Prior to Admission  Medication Sig Dispense Refill Last Dose  . metFORMIN (GLUCOPHAGE) 500 MG tablet Take 1 tablet (500 mg total) by mouth 2 (two) times daily with a meal. (Patient not taking: Reported on 03/04/2018) 60 tablet 0 Not Taking at Unknown time  . ondansetron (ZOFRAN ODT) 4 MG disintegrating tablet Take 1 tablet (4 mg total) by mouth every 8 (eight) hours as needed for nausea or vomiting. (Patient not taking: Reported on 03/04/2018) 20 tablet 0 Not Taking at Unknown time    Current medications: Current Facility-Administered Medications  Medication Dose Route Frequency Provider Last Rate Last Dose  . acetaminophen (TYLENOL) tablet 650 mg  650 mg Oral Q6H PRN Adrian Saran, MD       Or  . acetaminophen (TYLENOL) suppository 650 mg  650 mg Rectal Q6H PRN Mody, Sital, MD      . dexmedetomidine (PRECEDEX) 400 MCG/100ML (4 mcg/mL) infusion  0.4-1.2  mcg/kg/hr Intravenous Titrated Harlon Ditty D, NP 8.26 mL/hr at 03/05/18 0912 0.5 mcg/kg/hr at 03/05/18 0912  . dextrose 5 % solution   Intravenous Continuous Harlon Ditty D, NP 150 mL/hr at 03/05/18 0511    . dextrose 5 % solution   Intravenous Continuous Harlon Ditty D, NP 10 mL/hr at 03/05/18 0912    . enoxaparin (LOVENOX) injection 40 mg  40 mg Subcutaneous Q24H Adrian Saran, MD   40 mg at 03/04/18 2159  . folic acid (FOLVITE) tablet 1 mg  1 mg Oral Daily Mody, Sital, MD      . free water 300 mL  300 mL Per Tube Q4H Harlon Ditty D, NP      . HYDROcodone-acetaminophen (NORCO/VICODIN) 5-325 MG per tablet 1-2 tablet  1-2 tablet Oral Q4H PRN Mody, Sital, MD      . insulin aspart (novoLOG) injection 0-9 Units  0-9 Units Subcutaneous TID WC Mody, Sital, MD      . LORazepam (ATIVAN) injection 0-4 mg  0-4 mg Intravenous Q6H Emily Filbert, MD   Stopped at 03/05/18 0422   Or  . LORazepam (ATIVAN) tablet 0-4 mg  0-4 mg Oral Q6H Emily Filbert, MD      . Melene Muller ON 03/06/2018] LORazepam (ATIVAN) injection 0-4 mg  0-4 mg Intravenous Q12H Emily Filbert, MD   1 mg at 03/04/18 2010   Or  . [START ON 03/06/2018] LORazepam (ATIVAN) tablet 0-4 mg  0-4 mg Oral Q12H Emily Filbert, MD      . LORazepam (ATIVAN) tablet 1 mg  1 mg Oral Q6H PRN Adrian Saran, MD       Or  . LORazepam (ATIVAN) injection 1 mg  1 mg Intravenous Q6H PRN Mody, Sital, MD      . LORazepam (ATIVAN) tablet 0-4 mg  0-4 mg Oral Q6H Adrian Saran, MD   Stopped at 03/05/18 0423   Followed by  . [START ON 03/06/2018] LORazepam (ATIVAN) tablet 0-4 mg  0-4 mg Oral Q12H Mody, Sital, MD      . multivitamin with minerals tablet 1 tablet  1 tablet Oral Daily Mody, Sital, MD      . ondansetron (ZOFRAN) tablet 4 mg  4 mg Oral Q6H PRN Mody, Sital, MD       Or  . ondansetron (ZOFRAN) injection 4 mg  4 mg Intravenous Q6H PRN Mody, Sital, MD      . polyethylene glycol (MIRALAX / GLYCOLAX) packet 17 g  17 g Oral Daily PRN Mody,  Sital, MD      . potassium chloride 10 mEq in 100 mL IVPB  10 mEq Intravenous Q1 Hr x 4 Judithe Modest, NP   Stopped at 03/05/18 [phone]  . thiamine (VITAMIN B-1) tablet 100 mg  100 mg Oral Daily Emily Filbert, MD       Or  . thiamine (B-1) injection 100 mg  100 mg Intravenous Daily Emily Filbert, MD   100 mg at 03/04/18 1413  . thiamine (VITAMIN B-1) tablet 100 mg  100 mg Oral Daily Mody, Sital, MD       Or  . thiamine (B-1) injection 100 mg  100 mg Intravenous Daily Adrian Saran, MD          Allergies: No Known Allergies    Past Medical History: History reviewed. No pertinent past medical history.   Past Surgical History: Past Surgical History:  Procedure Laterality Date  . FRACTURE SURGERY       Family History: No family history on file.   Social History: Social History   Socioeconomic History  . Marital status: Married    Spouse name: Not on file  . Number of children: Not on file  . Years of education: Not on file  . Highest education level: Not on file  Occupational History  . Not on file  Social Needs  . Financial resource strain: Not on file  . Food insecurity:    Worry: Not on file    Inability: Not on file  . Transportation needs:    Medical: Not on file    Non-medical: Not on file  Tobacco Use  . Smoking status: Never Smoker  . Smokeless tobacco: Never Used  Substance and Sexual Activity  . Alcohol use: Yes  . Drug use: No  . Sexual activity: Yes  Lifestyle  . Physical activity:    Days per week: Not on file    Minutes per session: Not on file  . Stress: Not on file  Relationships  . Social connections:    Talks on phone: Not on file    Gets together: Not on file  Attends religious service: Not on file    Active member of club or organization: Not on file    Attends meetings of clubs or organizations: Not on file    Relationship status: Not on file  . Intimate partner violence:    Fear of current or ex partner: Not on  file    Emotionally abused: Not on file    Physically abused: Not on file    Forced sexual activity: Not on file  Other Topics Concern  . Not on file  Social History Narrative  . Not on file     Review of Systems: Patient unable to offer at the moment due to lethargy  Vital Signs: Blood pressure 100/64, pulse (!) 55, temperature 98.3 F (36.8 C), temperature source Oral, resp. rate 10, height 5\' 4"  (1.626 m), weight 66.1 kg, SpO2 100 %.  Weight trends: Filed Weights   03/04/18 1256 03/04/18 2147  Weight: 65 kg 66.1 kg    Physical Exam: General: NAD, resting in bed  Head: Normocephalic, atraumatic.  Eyes: Anicteric, EOMI  Nose: Mucous membranes moist, not inflammed, nonerythematous.  Throat: Oropharynx nonerythematous, no exudate appreciated.   Neck: Supple, trachea midline.  Lungs:  Normal respiratory effort. Clear to auscultation BL without crackles or wheezes.  Heart: RRR. S1 and S2 normal without gallop, murmur, or rubs.  Abdomen:  BS normoactive. Soft, Nondistended, non-tender.  No masses or organomegaly.  Extremities: No pretibial edema.  Neurologic: Lethargic in mitt restraints  Skin: No visible rashes, scars.    Lab results: Basic Metabolic Panel: Recent Labs  Lab 03/04/18 1301  03/05/18 0251 03/05/18 0452 03/05/18 0656  NA 113*   < > 126* 127* 125*  K 3.4*  --   --  2.8*  --   CL 74*  --   --  93*  --   CO2 18*  --   --  24  --   GLUCOSE 133*  --   --  189*  --   BUN 14  --   --  16  --   CREATININE 1.11  --   --  0.79  --   CALCIUM 8.9  --   --  8.6*  --    < > = values in this interval not displayed.    Liver Function Tests: Recent Labs  Lab 03/04/18 1301  AST 89*  ALT 44  ALKPHOS 58  BILITOT 2.2*  PROT 7.5  ALBUMIN 4.6   No results for input(s): LIPASE, AMYLASE in the last 168 hours. Recent Labs  Lab 03/04/18 1302  AMMONIA 34    CBC: Recent Labs  Lab 03/04/18 1301 03/05/18 0452  WBC 12.9* 4.9  NEUTROABS 10.9*  --   HGB  11.1* 9.6*  HCT 30.1* 26.8*  MCV 86.7 88.7  PLT 284 210    Cardiac Enzymes: Recent Labs  Lab 03/04/18 1301 03/04/18 2152  CKTOTAL  --  2,429*  TROPONINI <0.03  --     BNP: Invalid input(s): POCBNP  CBG: Recent Labs  Lab 03/04/18 1322 03/04/18 2144 03/05/18 0720  GLUCAP 129* 99 362*    Microbiology: Results for orders placed or performed during the hospital encounter of 03/04/18  Blood culture (routine x 2)     Status: None (Preliminary result)   Collection Time: 03/04/18  1:02 PM  Result Value Ref Range Status   Specimen Description BLOOD RIGHT [facility]  Final   Special Requests   Final    BOTTLES DRAWN AEROBIC AND ANAEROBIC  Blood Culture adequate volume   Culture   Final    NO GROWTH < 24 HOURS Performed at [facility], [address] [place], [place] [address]    Report Status PENDING  Incomplete  Blood culture (routine x 2)     Status: None (Preliminary result)   Collection Time: 03/04/18  1:02 PM  Result Value Ref Range Status   Specimen Description BLOOD LEFT AC  Final   Special Requests   Final    BOTTLES DRAWN AEROBIC AND ANAEROBIC Blood Culture results may not be optimal due to an excessive volume of blood received in culture bottles   Culture   Final    NO GROWTH < 24 HOURS Performed at [facility], [address] [place], [place] [address]    Report Status PENDING  Incomplete  MRSA PCR Screening     Status: None   Collection Time: 03/04/18  9:39 PM  Result Value Ref Range Status   MRSA by PCR NEGATIVE NEGATIVE Final    Comment:        The GeneXpert MRSA Assay (FDA approved for NASAL specimens only), is one component of a comprehensive MRSA colonization surveillance program. It is not intended to diagnose MRSA infection nor to guide or monitor treatment for MRSA infections. Performed at [facility], [address] [place], [place] [address]     Coagulation Studies: Recent Labs    03/04/18 1301  LABPROT  12.7  INR 0.96    Urinalysis: Recent Labs    03/04/18 1327  COLORURINE YELLOW*  LABSPEC 1.004*  PHURINE 6.0  GLUCOSEU 50*  HGBUR SMALL*  BILIRUBINUR NEGATIVE  KETONESUR 5*  PROTEINUR NEGATIVE  NITRITE NEGATIVE  LEUKOCYTESUR NEGATIVE      Imaging: Dg Chest 1 View  Result Date: 03/04/2018 CLINICAL DATA:  Abdominal pain.  Patient found down. EXAM: CHEST  1 VIEW COMPARISON:  February 17, 2013 and July 03, 2014 FINDINGS: The mediastinum is more prominent the interval, possibly due to the low volume portable technique. The heart hila are normal. No pneumothorax. No other acute abnormalities. IMPRESSION: Mild increased prominence of the mediastinum may be technical in nature. Recommend a PA and lateral chest x-ray for better evaluation. No other acute abnormalities. Electronically Signed   By: Gerome Samavid  Williams III M.D   On: 03/04/2018 13:44   Dg Pelvis 1-2 Views  Result Date: 03/04/2018 CLINICAL DATA:  Fall.  Left hip pain EXAM: PELVIS - 1-2 VIEW COMPARISON:  07/04/2013 FINDINGS: Left hip replacement in good position and unchanged. No acute fracture or bone lesion in the pelvis. IMPRESSION: No acute abnormality.  Left hip replacement Electronically Signed   By: Marlan Palauharles  Clark M.D.   On: 03/04/2018 14:15   Dg Abd 1 View  Result Date: 03/05/2018 CLINICAL DATA:  62 year old male with NG tube placement. EXAM: ABDOMEN - 1 VIEW COMPARISON:  Abdominal radiograph dated 03/04/2018 FINDINGS: Partially visualized enteric tube with tip and side-port in the proximal stomach. Air-filled loops of small bowel noted. Degenerative changes of the spine. IMPRESSION: Enteric tube with tip in the proximal stomach. Electronically Signed   By: Elgie CollardArash  Radparvar M.D.   On: 03/05/2018 06:38   Ct Head Wo Contrast  Result Date: 03/04/2018 CLINICAL DATA:  Patient found on floor by family. Pain on the left side of head. Altered level of consciousness. EXAM: CT HEAD WITHOUT CONTRAST TECHNIQUE: Contiguous axial images  were obtained from the base of the skull through the vertex without intravenous contrast. COMPARISON:  Report  from 02/12/1999 FINDINGS: Brain: Age related involutional changes of the brain with mild sulcal and ventricular prominence. Mild-to-moderate small vessel ischemic disease of periventricular white matter. No acute intracranial hemorrhage, midline shift or edema. No intra-axial mass nor extra-axial fluid. Patent basal cisterns. Brainstem and cerebellum appear intact without acute abnormality. Vascular: Atherosclerosis of carotid siphons. No hyperdense vessel sign. Skull: Intact Sinuses/Orbits: No acute finding. Other: Clear mastoids. IMPRESSION: 1. Age related involutional changes of the brain. 2. Likely chronic small vessel ischemic disease periventricular white matter with atherosclerosis at the skull base. 3. No acute intracranial abnormality. Electronically Signed   By: Tollie Eth M.D.   On: 03/04/2018 13:55   Ct Hip Left Wo Contrast  Result Date: 03/04/2018 CLINICAL DATA:  Left hip pain after being found on floor. Negative x-rays earlier today. EXAM: CT OF THE LEFT HIP WITHOUT CONTRAST TECHNIQUE: Multidetector CT imaging of the left hip was performed according to the standard protocol. Multiplanar CT image reconstructions were also generated. COMPARISON:  Same day pelvic radiographs FINDINGS: Bones/Joint/Cartilage Intact uncemented left hip arthroplasty without periprosthetic fracture or loosening. No hardware failure. Ligaments Suboptimally assessed by CT. Muscles and Tendons Negative for intramuscular hemorrhage, edema or tear. Soft tissues Negative for hematoma or fluid collection.  No soft tissue mass. IMPRESSION: Intact left hip arthroplasty without acute soft tissue abnormalities noted. Electronically Signed   By: Tollie Eth M.D.   On: 03/04/2018 16:47   [place] Ekg Site Rite  Result Date: 03/04/2018 If Site Rite image not attached, placement could not be confirmed due to current cardiac  rhythm.     Assessment & Plan: Pt is a 62 y.o. male  with a PMHx of alcohol abuse, who was admitted to [facility] on 03/04/2018 for evaluation of generalized weakness, fall, and suspected alcohol withdrawal.   1.  Hyponatremia. 2.  ETOH abuse. 3.  Hypokalemia. 4.  Anemia unspecified.   Plan:  We are consulted for evaluation management of hyponatremia.  Serum sodium yesterday at 1 PM was 113.  Rapid rise of sodium was noted and patient has been administered D5W as a bolus and will now be started on an infusion of D5W at 150 cc/h.  Serum sodium continues to be checked every 2 hours.  She has been administered IV potassium this a.m. as well.  Further plan as patient progresses.  Thanks for consultation.

## 2018-03-05 NOTE — Progress Notes (Signed)
   03/05/18 1600  Clinical Encounter Type  Visited With Patient and family together  Visit Type Initial  Referral From Family  Spiritual Encounters  Spiritual Needs Emotional;Other (Comment)

## 2018-03-06 LAB — GLUCOSE, CAPILLARY
Glucose-Capillary: 170 mg/dL — ABNORMAL HIGH (ref 70–99)
Glucose-Capillary: 117 mg/dL — ABNORMAL HIGH (ref 70–99)
Glucose-Capillary: 170 mg/dL — ABNORMAL HIGH (ref 70–99)
Glucose-Capillary: 151 mg/dL — ABNORMAL HIGH (ref 70–99)

## 2018-03-06 LAB — SODIUM
SODIUM: 123 mmol/L — AB (ref 135–145)
SODIUM: 120 mmol/L — AB (ref 135–145)
SODIUM: 122 mmol/L — AB (ref 135–145)
Sodium: 118 mmol/L — CL (ref 135–145)
Sodium: 119 mmol/L — CL (ref 135–145)
Sodium: 123 mmol/L — ABNORMAL LOW (ref 135–145)
Sodium: 123 mmol/L — ABNORMAL LOW (ref 135–145)
Sodium: 123 mmol/L — ABNORMAL LOW (ref 135–145)

## 2018-03-06 LAB — BASIC METABOLIC PANEL
Anion gap: 5 (ref 5–15)
BUN: 9 mg/dL (ref 8–23)
CO2: 24 mmol/L (ref 22–32)
Calcium: 8.2 mg/dL — ABNORMAL LOW (ref 8.9–10.3)
Chloride: 92 mmol/L — ABNORMAL LOW (ref 98–111)
Creatinine, Ser: 0.53 mg/dL — ABNORMAL LOW (ref 0.61–1.24)
GFR calc non Af Amer: >60 mL/min (ref >60)
Glucose, Bld: 176 mg/dL — ABNORMAL HIGH (ref 70–99)
Potassium: 3.6 mmol/L (ref 3.5–5.1)
Sodium: 121 mmol/L — ABNORMAL LOW (ref 135–145)

## 2018-03-06 LAB — POTASSIUM: POTASSIUM: 3.3 mmol/L — AB (ref 3.5–5.1)

## 2018-03-06 LAB — CK: Total CK: 582 U/L — ABNORMAL HIGH (ref 49–397)

## 2018-03-06 LAB — HIV ANTIBODY (ROUTINE TESTING W REFLEX): HIV Screen 4th Generation wRfx: NONREACTIVE

## 2018-03-06 MED ORDER — DEXTROSE 5 % IV SOLN
INTRAVENOUS | Status: DC
  Administered 2018-03-06: 09:00:00 via INTRAVENOUS

## 2018-03-06 MED ORDER — KCL IN DEXTROSE-NACL 20-5-0.9 MEQ/L-%-% IV SOLN
INTRAVENOUS | Status: DC
  Administered 2018-03-06: 02:00:00 via INTRAVENOUS
  Filled 2018-03-06 (×2): qty 1000

## 2018-03-06 MED ORDER — POLYMYXIN B-TRIMETHOPRIM 10000-0.1 UNIT/ML-% OP SOLN
2.0000 [drp] | Freq: Four times a day (QID) | OPHTHALMIC | Status: DC
  Administered 2018-03-07 – 2018-03-09 (×8): 2 [drp] via OPHTHALMIC
  Filled 2018-03-06: qty 10

## 2018-03-06 MED ORDER — POTASSIUM CHLORIDE CRYS ER 20 MEQ PO TBCR
40.0000 meq | EXTENDED_RELEASE_TABLET | Freq: Once | ORAL | Status: AC
  Administered 2018-03-06: 40 meq via ORAL
  Filled 2018-03-06: qty 2

## 2018-03-06 MED ORDER — TOBRAMYCIN-DEXAMETHASONE 0.3-0.1 % OP OINT
TOPICAL_OINTMENT | Freq: Four times a day (QID) | OPHTHALMIC | Status: DC
  Filled 2018-03-06: qty 3.5

## 2018-03-06 MED ORDER — POTASSIUM CHLORIDE CRYS ER 20 MEQ PO TBCR
40.0000 meq | EXTENDED_RELEASE_TABLET | ORAL | Status: AC
  Administered 2018-03-06 (×2): 40 meq via ORAL
  Filled 2018-03-06 (×2): qty 2

## 2018-03-06 NOTE — Care Management Note (Signed)
Case Management Note  Patient Details  Name: Geoffrey Solis MRN: [id number] Date of Birth: 12/21/1955  Subjective/Objective:    Patient admitted on 12/4 with hyponatremia.  Per ED notes patient was found on the floor of his basement by family.  Spanish interpreter utilized for [facility] assessment- pt reports that his daughter told him he needed to come to the ER and so he came to the ED via EMS.  Patient denies smoking or alcohol- per ED note daughter reports patient drinks 3-4 40 oz beers a day.  Patient reports that he does not work he cannot work  because he has been sick for the past 4 months.  RNCM asked how the patient has been sick and he states he just can't work and over the past month he has had numbness in his arms and legs.  Patient reports that he is independent in ADL's except for the past month he has not been able to bath himself because of the numbness in his arms and legs so his wife has been helping him with this.  Patient does not drive, if he needs transportation his daughter provides it.  Patient reports that [place].  He does not have a PCP or pharmacy.  RNCM informed patient of ODC and MM.  RNCM will provide application form in Spanish for patient to review.  Patient has no equipment in the home he has never had a need for any.  Patient has no insurance.  RNCM explained role in discharge planning, patient has no questions at this time. RNCM will cont to follow for discharge planning needs. Robbie LisJeanna Jinnifer Montejano RN BSN Case Management [phone]                  Action/Plan:   Expected Discharge Date:                  Expected Discharge Plan:     In-House Referral:     Discharge planning Services     Post Acute Care Choice:    Choice offered to:     DME Arranged:    DME Agency:     HH Arranged:    HH Agency:     Status of Service:     If discussed at Long Length of Stay Meetings, dates discussed:    Additional  Comments:  Allayne ButcherJeanna M Dante Cooter, RN 03/06/2018, 2:28 PM

## 2018-03-06 NOTE — Progress Notes (Signed)
Pharmacy Electrolyte Monitoring Consult:  Pharmacy consulted to assist in monitoring and replacing electrolytes in this 62 y.o. male admitted on 03/04/2018 with Shaking and Weakness s/t EtOH abuse and going through w/d and presenting to ED post fall.  Admission Na 113.  Labs:  Sodium (mmol/L)  Date Value  03/06/2018 123 (L)  07/03/2014 135   Potassium (mmol/L)  Date Value  03/06/2018 3.3 (L)  07/03/2014 4.0   Magnesium (mg/dL)  Date Value  02/72/536612/07/2017 2.1  02/21/2013 1.8   Phosphorus (mg/dL)  Date Value  44/03/474212/07/2017 2.7   Calcium (mg/dL)  Date Value  59/56/387512/08/2017 8.2 (L)   Calcium, Total (mg/dL)  Date Value  64/33/295104/05/2014 9.7   Albumin (g/dL)  Date Value  88/41/660612/06/2017 4.6  07/03/2014 4.7    Assessment/Plan: 3% saline was originally started @ 30 ml/hr, but Na has risen to more than 10 mEq/L over the past 12 hours and therefore saline has been stopped and D5 @ 150 ml/hr started s/t to CBG of 99 and possible malnutrition. Nephrology is following, Na is stablizing, stopping D5W and changing Na checks to q4h.  12/06 K 3.3 -- KCl 40mEq PO q4h x 2. Recheck BMET with AM labs.  Geoffrey Solis, PharmD, BCPS 03/06/2018 3:55 PM

## 2018-03-06 NOTE — Progress Notes (Signed)
Follow up - Critical Care Medicine Note  Patient Details:    Geoffrey Solis is an 62 y.o. male. with a PMH of left hip fracture and alcohol abuse, who presents to [facility] ED on 03/04/18 status post fall landing on his left hip and generalized weakness. It is reported that he was found on the floor by his family with no recollection of what happened, complaining of abdominal and head pain. Pt was noted to be tremulous by EMS, and It is also reported that he had his last alcoholic drink yesterday on 03/03/18. Initial workup in the ED revealed Sodium 113, Potassium 3.4, Serum Bicarb 18, anion gap 21, Beta-Hydroxybutric acid 2.28, WBC 12.9, and negative troponin. He was initially treated with 3% saline emergency department.  Lines, Airways, Drains: CVC Triple Lumen 03/04/18 Right Femoral (Active)  Indication for Insertion or Continuance of Line Administration of hyperosmolar/irritating solutions (i.e. TPN, Vancomycin, etc.) 03/06/2018  8:00 AM  Site Assessment Clean;Dry;Intact 03/06/2018  9:00 AM  Proximal Lumen Status Infusing 03/06/2018  9:00 AM  Medial Lumen Status Flushed;Saline locked;Blood return noted 03/06/2018  9:00 AM  Distal Lumen Status In-line blood sampling system in place 03/06/2018  9:00 AM  Dressing Type Transparent 03/06/2018  9:00 AM  Dressing Status Clean;Dry;Intact 03/06/2018  9:00 AM  Line Care Connections checked and tightened 03/06/2018  9:00 AM  Dressing Change Due 03/11/18 03/06/2018  9:00 AM     External Urinary Catheter (Active)  Collection Container Standard drainage bag 03/06/2018  8:00 AM  Securement Method Leg strap 03/06/2018  8:00 AM  Intervention Equipment Changed 03/06/2018  8:00 AM  Output (mL) 650 mL 03/06/2018  5:46 AM    Anti-infectives:  Anti-infectives (From admission, onward)   None      Microbiology: Results for orders placed or performed during the hospital encounter of 03/04/18  Blood culture (routine x 2)     Status: None (Preliminary result)    Collection Time: 03/04/18  1:02 PM  Result Value Ref Range Status   Specimen Description BLOOD RIGHT [facility]  Final   Special Requests   Final    BOTTLES DRAWN AEROBIC AND ANAEROBIC Blood Culture adequate volume   Culture   Final    NO GROWTH 2 DAYS Performed at [facility], [address], [place], [place] [address]    Report Status PENDING  Incomplete  Blood culture (routine x 2)     Status: None (Preliminary result)   Collection Time: 03/04/18  1:02 PM  Result Value Ref Range Status   Specimen Description BLOOD LEFT AC  Final   Special Requests   Final    BOTTLES DRAWN AEROBIC AND ANAEROBIC Blood Culture results may not be optimal due to an excessive volume of blood received in culture bottles   Culture   Final    NO GROWTH 2 DAYS Performed at [facility], [address] [place], [place] [address]    Report Status PENDING  Incomplete  MRSA PCR Screening     Status: None   Collection Time: 03/04/18  9:39 PM  Result Value Ref Range Status   MRSA by PCR NEGATIVE NEGATIVE Final    Comment:        The GeneXpert MRSA Assay (FDA approved for NASAL specimens only), is one component of a comprehensive MRSA colonization surveillance program. It is not intended to diagnose MRSA infection nor to guide or monitor treatment for MRSA infections. Performed at [facility], [address], [place], [place] [address]  Studies: Dg Chest 1 View  Result Date: 03/04/2018 CLINICAL DATA:  Abdominal pain.  Patient found down. EXAM: CHEST  1 VIEW COMPARISON:  February 17, 2013 and July 03, 2014 FINDINGS: The mediastinum is more prominent the interval, possibly due to the low volume portable technique. The heart hila are normal. No pneumothorax. No other acute abnormalities. IMPRESSION: Mild increased prominence of the mediastinum may be technical in nature. Recommend a PA and lateral chest x-ray for better evaluation. No other acute abnormalities. Electronically Signed    By: Gerome Samavid  Williams III M.D   On: 03/04/2018 13:44   Dg Pelvis 1-2 Views  Result Date: 03/04/2018 CLINICAL DATA:  Fall.  Left hip pain EXAM: PELVIS - 1-2 VIEW COMPARISON:  07/04/2013 FINDINGS: Left hip replacement in good position and unchanged. No acute fracture or bone lesion in the pelvis. IMPRESSION: No acute abnormality.  Left hip replacement Electronically Signed   By: Marlan Palauharles  Clark M.D.   On: 03/04/2018 14:15   Dg Abd 1 View  Result Date: 03/05/2018 CLINICAL DATA:  62 year old male with NG tube placement. EXAM: ABDOMEN - 1 VIEW COMPARISON:  Abdominal radiograph dated 03/04/2018 FINDINGS: Partially visualized enteric tube with tip and side-port in the proximal stomach. Air-filled loops of small bowel noted. Degenerative changes of the spine. IMPRESSION: Enteric tube with tip in the proximal stomach. Electronically Signed   By: Elgie CollardArash  Radparvar M.D.   On: 03/05/2018 06:38   Ct Head Wo Contrast  Result Date: 03/04/2018 CLINICAL DATA:  Patient found on floor by family. Pain on the left side of head. Altered level of consciousness. EXAM: CT HEAD WITHOUT CONTRAST TECHNIQUE: Contiguous axial images were obtained from the base of the skull through the vertex without intravenous contrast. COMPARISON:  Report from 02/12/1999 FINDINGS: Brain: Age related involutional changes of the brain with mild sulcal and ventricular prominence. Mild-to-moderate small vessel ischemic disease of periventricular white matter. No acute intracranial hemorrhage, midline shift or edema. No intra-axial mass nor extra-axial fluid. Patent basal cisterns. Brainstem and cerebellum appear intact without acute abnormality. Vascular: Atherosclerosis of carotid siphons. No hyperdense vessel sign. Skull: Intact Sinuses/Orbits: No acute finding. Other: Clear mastoids. IMPRESSION: 1. Age related involutional changes of the brain. 2. Likely chronic small vessel ischemic disease periventricular white matter with atherosclerosis at the skull  base. 3. No acute intracranial abnormality. Electronically Signed   By: Tollie Ethavid  Kwon M.D.   On: 03/04/2018 13:55   [place] Abdomen Complete  Result Date: 03/05/2018 CLINICAL DATA:  Abdominal pain. EXAM: ABDOMEN ULTRASOUND COMPLETE COMPARISON:  CT of the chest abdomen and pelvis 07/03/2014 FINDINGS: Gallbladder: Innumerable mobile gallstones are identified in the gallbladder, measuring on the order of 7 millimeters in diameter. No sonographic Murphy sign. No pericholecystic fluid. Gallbladder wall is normal in thickness, 1.5 millimeters. Common bile duct: Diameter: 4.0 millimeters Liver: No focal lesion identified. Within normal limits in parenchymal echogenicity. Portal vein is patent on color Doppler imaging with normal direction of blood flow towards the liver. IVC: No abnormality visualized. Pancreas: Not visualized secondary to bowel gas. Spleen: Size and appearance within normal limits. Right Kidney: Length: 11.9 centimeters. Echogenicity within normal limits. No mass or hydronephrosis visualized. Left Kidney: Length: 11.7 centimeters. Echogenicity within normal limits. No mass or hydronephrosis visualized. Abdominal aorta: Portions of the aorta are obscured by bowel gas. The visualized portion is unremarkable. Other findings: None. IMPRESSION: 1. Cholelithiasis without other ultrasound evidence for acute cholecystitis. 2. No hydronephrosis or acute abnormality. Electronically Signed   By: Norva PavlovElizabeth  Brown M.D.  On: 03/05/2018 18:13   Ct Hip Left Wo Contrast  Result Date: 03/04/2018 CLINICAL DATA:  Left hip pain after being found on floor. Negative x-rays earlier today. EXAM: CT OF THE LEFT HIP WITHOUT CONTRAST TECHNIQUE: Multidetector CT imaging of the left hip was performed according to the standard protocol. Multiplanar CT image reconstructions were also generated. COMPARISON:  Same day pelvic radiographs FINDINGS: Bones/Joint/Cartilage Intact uncemented left hip arthroplasty without periprosthetic  fracture or loosening. No hardware failure. Ligaments Suboptimally assessed by CT. Muscles and Tendons Negative for intramuscular hemorrhage, edema or tear. Soft tissues Negative for hematoma or fluid collection.  No soft tissue mass. IMPRESSION: Intact left hip arthroplasty without acute soft tissue abnormalities noted. Electronically Signed   By: Tollie Eth M.D.   On: 03/04/2018 16:47   [place] Ekg Site Rite  Result Date: 03/04/2018 If Site Rite image not attached, placement could not be confirmed due to current cardiac rhythm.   Consults: Treatment Team:  Pccm, Raymond Gurney, MD Mady Haagensen, MD   Subjective:    Overnight Issues: Patient mental status is significantly improved.  Awake, alert and communicating.  Language barrier, he says he speaks a little [place].  Most recent sodium is 123.  Fluids are being adjusted by nephrology  Objective:  Vital signs for last 24 hours: Temp:  [98 F (36.7 C)-99 F (37.2 C)] 98.3 F (36.8 C) (12/06 0800) Pulse Rate:  [56-137] 89 (12/06 0800) Resp:  [9-31] 14 (12/06 0800) BP: (85-157)/(55-110) 119/68 (12/06 0800) SpO2:  [75 %-100 %] 99 % (12/06 0800)  Intake/Output from previous day: 12/05 0701 - 12/06 0700 In: 3359.6 [I.V.:2497.7; IV Piggyback:861.9] Out: 5600 [Urine:5600]  Intake/Output this shift: Total I/O In: 38.4 [I.V.:38.4] Out: -   Vent settings for last 24 hours:    Physical Exam:  Vital signs: Please see the above listed vital signs HEENT: Trachea midline, no thyromegaly appreciated, no oral lesions noted Cardiovascular: Regular rate and rhythm Pulmonary: Clear to auscultation Abdominal: Positive bowel sounds Extremities: No clubbing, cyanosis or edema noted Neurologic: Significantly improved mental status, awake alert and communicating  Assessment/Plan:   Hyponatremia improving.  Fluids being adjusted by nephrology.  We will follow all recommendations  Elevated CK.  Coming down nicely with CK at 582  EtOH abuse  per history  Hypokalemia.  Following electrolytes  Anemia.  No evidence of active bleeding  Tora Kindred, DO  Geoffrey Solis 03/06/2018  *Care during the described time interval was provided by me and/or other providers on the critical care team.  I have reviewed this patient's available data, including medical history, events of note, physical examination and test results as part of my evaluation.

## 2018-03-06 NOTE — Progress Notes (Signed)
Pharmacy Electrolyte Monitoring Consult:  Pharmacy consulted to assist in monitoring and replacing electrolytes in this 62 y.o. male admitted on 03/04/2018 with Shaking and Weakness s/t EtOH abuse and going through w/d and presenting to ED post fall.  Admission Na 113.  Labs:  Sodium (mmol/L)  Date Value  03/06/2018 118 (LL)  07/03/2014 135   Potassium (mmol/L)  Date Value  03/05/2018 3.6  07/03/2014 4.0   Magnesium (mg/dL)  Date Value  19/14/782912/07/2017 2.1  02/21/2013 1.8   Phosphorus (mg/dL)  Date Value  56/21/308612/07/2017 2.7   Calcium (mg/dL)  Date Value  57/84/696212/07/2017 8.9   Calcium, Total (mg/dL)  Date Value  95/28/413204/05/2014 9.7   Albumin (g/dL)  Date Value  44/01/027212/06/2017 4.6  07/03/2014 4.7    Assessment/Plan: Na:  12/04 1300 113 12/04 1600 116 12/04 1700 121 12/04 2200 122 12/05 0100 122 12/05 0300 126 12/05 0500 127 12/06  3% saline was originally started @ 30 ml/hr, but Na has risen to more than 10 mEq/L over the past 12 hours and therefore saline has been stopped and D5 @ 150 ml/hr started s/t to CBG of 99 and possible malnutrition. Na checks q2h. Nephrology is following and adjusting Dextrose.  12/05 1200 K 2.9 -- pharmacy now consulted to manage electrolytes (nephro consulted for Na). Patient received KCl 10 mEq IV x 4. Will order KCl 10mEq IV x 4 and recheck K level at 23:00. Will continue to monitor q2h Na checks.  12/05 2300 K 3.6 will replace w/ KCI 40 mEq PO x 1 and will f/u w/ am labs. Na 118 spoke to NP will switch fluids to D5 + NS + 10 KCI, NP agrees w/ plan. Will continue q2h Na checks and f/u w/ am labs.  Thomasene Rippleavid Stylianos Stradling, PharmD, BCPS Clinical Pharmacist 03/06/2018

## 2018-03-06 NOTE — Progress Notes (Signed)
[employer] -  at [facility]   PATIENT NAME: Geoffrey Solis    MR#:  [id number]  DATE OF BIRTH:  [birth year]  SUBJECTIVE:  CHIEF COMPLAINT:   Chief Complaint  Patient presents with  . Shaking  . Weakness  Patient seen today Off IV Precedex drip Awake and responds to all verbal commands Has some discomfort in the throat  REVIEW OF SYSTEMS:    ROS Review of Systems  Constitutional: Negative.   HENT: Negative.   Eyes: Negative.   Respiratory: Negative.   Cardiovascular: Negative.   Gastrointestinal: Negative.   Genitourinary: Negative.   Musculoskeletal: Negative.   Skin: Negative.   Neurological: Negative.   Endo/Heme/Allergies: Negative.   Psychiatric/Behavioral: mood pleasant  All other systems reviewed and are negative.  DRUG ALLERGIES:  No Known Allergies  VITALS:  Blood pressure 119/68, pulse 89, temperature 98.3 F (36.8 C), temperature source Oral, resp. rate 14, height 5\' 4"  (1.626 m), weight 66.1 kg, SpO2 99 %.  PHYSICAL EXAMINATION:   Physical Exam  GENERAL:  62 y.o.-year-old patient lying in the bed in no acute distress EYES: Pupils equal, round, reactive to light and accommodation. No scleral icterus. Extraocular muscles intact.  HEENT: Head atraumatic, normocephalic. Oropharynx and nasopharynx clear.  NECK:  Supple, no jugular venous distention. No thyroid enlargement, no tenderness.  LUNGS: Normal breath sounds bilaterally, no wheezing, rales, rhonchi. No use of accessory muscles of respiration.  CARDIOVASCULAR: S1, S2 normal. No murmurs, rubs, or gallops.  ABDOMEN: Soft, nontender, nondistended. Bowel sounds present. No organomegaly or mass.  EXTREMITIES: No cyanosis, clubbing or edema b/l.    NEUROLOGIC: Cranial nerves II through XII intact Sensory system intact Power 5 x 5 all extremities No cerebellar signs noted PSYCHIATRIC: The patient is alert and oriented x 3 SKIN: No obvious rash, lesion, or ulcer.    LABORATORY PANEL:   CBC Recent Labs  Lab 03/05/18 0452  WBC 4.9  HGB 9.6*  HCT 26.8*  PLT 210   ------------------------------------------------------------------------------------------------------------------ Chemistries  Recent Labs  Lab 03/04/18 1301  03/05/18 0945  03/06/18 0507  03/06/18 0922  NA 113*   < > 126*   < > 121*   < > 123*  K 3.4*   < >  --    < > 3.6  --   --   CL 74*   < >  --    < > 92*  --   --   CO2 18*   < >  --    < > 24  --   --   GLUCOSE 133*   < >  --    < > 176*  --   --   BUN 14   < >  --    < > 9  --   --   CREATININE 1.11   < >  --    < > 0.53*  --   --   CALCIUM 8.9   < >  --    < > 8.2*  --   --   MG  --   --  2.1  --   --   --   --   AST 89*  --   --   --   --   --   --   ALT 44  --   --   --   --   --   --   ALKPHOS 58  --   --   --   --   --   --  BILITOT 2.2*  --   --   --   --   --   --    < > = values in this interval not displayed.   ------------------------------------------------------------------------------------------------------------------  Cardiac Enzymes Recent Labs  Lab 03/04/18 1301  TROPONINI <0.03   ------------------------------------------------------------------------------------------------------------------  RADIOLOGY:  Dg Chest 1 View  Result Date: 03/04/2018 CLINICAL DATA:  Abdominal pain.  Patient found down. EXAM: CHEST  1 VIEW COMPARISON:  February 17, 2013 and July 03, 2014 FINDINGS: The mediastinum is more prominent the interval, possibly due to the low volume portable technique. The heart hila are normal. No pneumothorax. No other acute abnormalities. IMPRESSION: Mild increased prominence of the mediastinum may be technical in nature. Recommend a PA and lateral chest x-ray for better evaluation. No other acute abnormalities. Electronically Signed   By: Gerome Sam III M.D   On: 03/04/2018 13:44   Dg Pelvis 1-2 Views  Result Date: 03/04/2018 CLINICAL DATA:  Fall.  Left hip pain EXAM: PELVIS -  1-2 VIEW COMPARISON:  07/04/2013 FINDINGS: Left hip replacement in good position and unchanged. No acute fracture or bone lesion in the pelvis. IMPRESSION: No acute abnormality.  Left hip replacement Electronically Signed   By: Marlan Palau M.D.   On: 03/04/2018 14:15   Dg Abd 1 View  Result Date: 03/05/2018 CLINICAL DATA:  62 year old male with NG tube placement. EXAM: ABDOMEN - 1 VIEW COMPARISON:  Abdominal radiograph dated 03/04/2018 FINDINGS: Partially visualized enteric tube with tip and side-port in the proximal stomach. Air-filled loops of small bowel noted. Degenerative changes of the spine. IMPRESSION: Enteric tube with tip in the proximal stomach. Electronically Signed   By: Elgie Collard M.D.   On: 03/05/2018 06:38   Ct Head Wo Contrast  Result Date: 03/04/2018 CLINICAL DATA:  Patient found on floor by family. Pain on the left side of head. Altered level of consciousness. EXAM: CT HEAD WITHOUT CONTRAST TECHNIQUE: Contiguous axial images were obtained from the base of the skull through the vertex without intravenous contrast. COMPARISON:  Report from 02/12/1999 FINDINGS: Brain: Age related involutional changes of the brain with mild sulcal and ventricular prominence. Mild-to-moderate small vessel ischemic disease of periventricular white matter. No acute intracranial hemorrhage, midline shift or edema. No intra-axial mass nor extra-axial fluid. Patent basal cisterns. Brainstem and cerebellum appear intact without acute abnormality. Vascular: Atherosclerosis of carotid siphons. No hyperdense vessel sign. Skull: Intact Sinuses/Orbits: No acute finding. Other: Clear mastoids. IMPRESSION: 1. Age related involutional changes of the brain. 2. Likely chronic small vessel ischemic disease periventricular white matter with atherosclerosis at the skull base. 3. No acute intracranial abnormality. Electronically Signed   By: Tollie Eth M.D.   On: 03/04/2018 13:55   US Abdomen Complete  Result Date:  03/05/2018 CLINICAL DATA:  Abdominal pain. EXAM: ABDOMEN ULTRASOUND COMPLETE COMPARISON:  CT of the chest abdomen and pelvis 07/03/2014 FINDINGS: Gallbladder: Innumerable mobile gallstones are identified in the gallbladder, measuring on the order of 7 millimeters in diameter. No sonographic Murphy sign. No pericholecystic fluid. Gallbladder wall is normal in thickness, 1.5 millimeters. Common bile duct: Diameter: 4.0 millimeters Liver: No focal lesion identified. Within normal limits in parenchymal echogenicity. Portal vein is patent on color Doppler imaging with normal direction of blood flow towards the liver. IVC: No abnormality visualized. Pancreas: Not visualized secondary to bowel gas. Spleen: Size and appearance within normal limits. Right Kidney: Length: 11.9 centimeters. Echogenicity within normal limits. No mass or hydronephrosis visualized. Left Kidney: Length: 11.7 centimeters. Echogenicity within  normal limits. No mass or hydronephrosis visualized. Abdominal aorta: Portions of the aorta are obscured by bowel gas. The visualized portion is unremarkable. Other findings: None. IMPRESSION: 1. Cholelithiasis without other ultrasound evidence for acute cholecystitis. 2. No hydronephrosis or acute abnormality. Electronically Signed   By: Norva PavlovElizabeth  Brown M.D.   On: 03/05/2018 18:13   Ct Hip Left Wo Contrast  Result Date: 03/04/2018 CLINICAL DATA:  Left hip pain after being found on floor. Negative x-rays earlier today. EXAM: CT OF THE LEFT HIP WITHOUT CONTRAST TECHNIQUE: Multidetector CT imaging of the left hip was performed according to the standard protocol. Multiplanar CT image reconstructions were also generated. COMPARISON:  Same day pelvic radiographs FINDINGS: Bones/Joint/Cartilage Intact uncemented left hip arthroplasty without periprosthetic fracture or loosening. No hardware failure. Ligaments Suboptimally assessed by CT. Muscles and Tendons Negative for intramuscular hemorrhage, edema or tear.  Soft tissues Negative for hematoma or fluid collection.  No soft tissue mass. IMPRESSION: Intact left hip arthroplasty without acute soft tissue abnormalities noted. Electronically Signed   By: Tollie Ethavid  Kwon M.D.   On: 03/04/2018 16:47   [place] Ekg Site Rite  Result Date: 03/04/2018 If Site Rite image not attached, placement could not be confirmed due to current cardiac rhythm.    ASSESSMENT AND PLAN:   62 year old male patient with history of Etha abuse and dependence currently in the ICU for electrolyte abnormality and mechanical fall  - hyponatremia Resolving Off 3% hypertonic saline Status post nephrology evaluation Probably secondary to alcohol abuse  -Elevated CK levels Rhabdomyolysis Improving with IV fluids  -Alcohol withdrawal/alcohol abuse Continue CIWA protocol  -Alcoholic ketoacidosis improved IV fluids  -Mechanical fall and left hip pain No evidence of fracture on CT hip Intact left hip arthroplasty  -Acute hypokalemia improved  -Anemia appears chronic No evidence of active GI bleed  All the records are reviewed and case discussed with Care Management/Social Worker. Management plans discussed with the patient, family and they are in agreement.  CODE STATUS: Full code  DVT Prophylaxis: SCDs  TOTAL TIME TAKING CARE OF THIS PATIENT: 33 minutes.   POSSIBLE D/C IN 3 to 4 DAYS, DEPENDING ON CLINICAL CONDITION.  Ihor AustinPavan Frazier Balfour M.D on 03/06/2018 at 11:07 AM  Between 7am to 6pm - Pager - [phone]  After 6pm go to [email or web address] - password [email or web address]  [facility]  Office  [phone]  CC: Primary care physician; Patient, No Pcp Per  Note: This dictation was prepared with Dragon dictation along with smaller phrase technology. Any transcriptional errors that result from this process are unintentional.

## 2018-03-06 NOTE — Progress Notes (Signed)
[facility]  ROUNDING NOTE   Subjective:  Serum sodium was successfully stabilized with the use of D5W. D5W was stopped. Patient is more awake and alert today.   Objective:  Vital signs in last 24 hours:  Temp:  [98 F (36.7 C)-99 F (37.2 C)] 98.3 F (36.8 C) (12/06 0800) Pulse Rate:  [55-137] 89 (12/06 0800) Resp:  [9-31] 14 (12/06 0800) BP: (85-157)/(55-110) 119/68 (12/06 0800) SpO2:  [75 %-100 %] 99 % (12/06 0800)  Weight change:  Filed Weights   03/04/18 1256 03/04/18 2147  Weight: 65 kg 66.1 kg    Intake/Output: I/O last 3 completed shifts: In: 4854.2 [P.O.:430; I.V.:3562.4; IV Piggyback:861.9] Out: 7425 [Urine:7425]   Intake/Output this shift:  Total I/O In: 38.4 [I.V.:38.4] Out: -   Physical Exam: General: No acute distress  Head: Normocephalic, atraumatic. Moist oral mucosal membranes  Eyes: Anicteric  Neck: Supple, trachea midline  Lungs:  Clear to auscultation, normal effort  Heart: S1S2 no rubs  Abdomen:  Soft, nontender, bowel sounds present  Extremities: No peripheral edema.  Neurologic: Awake, alert, following commands  Skin: No lesions       Basic Metabolic Panel: Recent Labs  Lab 03/04/18 1301  03/05/18 0452  03/05/18 0945 03/05/18 1141  03/05/18 2254 03/06/18 0057 03/06/18 0311 03/06/18 0507 03/06/18 0703  NA 113*   < > 127*   < > 126* 129*   < > 121* 118* 119* 121* 123*  K 3.4*  --  2.8*  --   --  2.9*  --  3.6  --   --  3.6  --   CL 74*  --  93*  --   --  95*  --   --   --   --  92*  --   CO2 18*  --  24  --   --  24  --   --   --   --  24  --   GLUCOSE 133*  --  189*  --   --  66*  --   --   --   --  176*  --   BUN 14  --  16  --   --  14  --   --   --   --  9  --   CREATININE 1.11  --  0.79  --   --  0.78  --   --   --   --  0.53*  --   CALCIUM 8.9  --  8.6*  --   --  8.9  --   --   --   --  8.2*  --   MG  --   --   --   --  2.1  --   --   --   --   --   --   --   PHOS  --   --   --   --  2.7  --   --   --   --    --   --   --    < > = values in this interval not displayed.    Liver Function Tests: Recent Labs  Lab 03/04/18 1301  AST 89*  ALT 44  ALKPHOS 58  BILITOT 2.2*  PROT 7.5  ALBUMIN 4.6   Recent Labs  Lab 03/05/18 1809  LIPASE 26  AMYLASE 57   Recent Labs  Lab 03/04/18 1302  AMMONIA 34    CBC:  Recent Labs  Lab 03/04/18 1301 03/05/18 0452  WBC 12.9* 4.9  NEUTROABS 10.9*  --   HGB 11.1* 9.6*  HCT 30.1* 26.8*  MCV 86.7 88.7  PLT 284 210    Cardiac Enzymes: Recent Labs  Lab 03/04/18 1301 03/04/18 2152 03/06/18 0507  CKTOTAL  --  2,429* 582*  TROPONINI <0.03  --   --     BNP: Invalid input(s): POCBNP  CBG: Recent Labs  Lab 03/05/18 0720 03/05/18 1120 03/05/18 1600 03/05/18 2119 03/06/18 0742  GLUCAP 362* 87 79 209* 170*    Microbiology: Results for orders placed or performed during the hospital encounter of 03/04/18  Blood culture (routine x 2)     Status: None (Preliminary result)   Collection Time: 03/04/18  1:02 PM  Result Value Ref Range Status   Specimen Description BLOOD RIGHT [facility]  Final   Special Requests   Final    BOTTLES DRAWN AEROBIC AND ANAEROBIC Blood Culture adequate volume   Culture   Final    NO GROWTH 2 DAYS Performed at [facility], [address], [place], [place] [address]    Report Status PENDING  Incomplete  Blood culture (routine x 2)     Status: None (Preliminary result)   Collection Time: 03/04/18  1:02 PM  Result Value Ref Range Status   Specimen Description BLOOD LEFT AC  Final   Special Requests   Final    BOTTLES DRAWN AEROBIC AND ANAEROBIC Blood Culture results may not be optimal due to an excessive volume of blood received in culture bottles   Culture   Final    NO GROWTH 2 DAYS Performed at [facility], [address] [place], [place] [address]    Report Status PENDING  Incomplete  MRSA PCR Screening     Status: None   Collection Time: 03/04/18  9:39 PM  Result Value Ref Range  Status   MRSA by PCR NEGATIVE NEGATIVE Final    Comment:        The GeneXpert MRSA Assay (FDA approved for NASAL specimens only), is one component of a comprehensive MRSA colonization surveillance program. It is not intended to diagnose MRSA infection nor to guide or monitor treatment for MRSA infections. Performed at [facility], [address] [place], [place] [address]     Coagulation Studies: Recent Labs    03/04/18 1301  LABPROT 12.7  INR 0.96    Urinalysis: Recent Labs    03/04/18 1327  COLORURINE YELLOW*  LABSPEC 1.004*  PHURINE 6.0  GLUCOSEU 50*  HGBUR SMALL*  BILIRUBINUR NEGATIVE  KETONESUR 5*  PROTEINUR NEGATIVE  NITRITE NEGATIVE  LEUKOCYTESUR NEGATIVE      Imaging: Dg Chest 1 View  Result Date: 03/04/2018 CLINICAL DATA:  Abdominal pain.  Patient found down. EXAM: CHEST  1 VIEW COMPARISON:  February 17, 2013 and July 03, 2014 FINDINGS: The mediastinum is more prominent the interval, possibly due to the low volume portable technique. The heart hila are normal. No pneumothorax. No other acute abnormalities. IMPRESSION: Mild increased prominence of the mediastinum may be technical in nature. Recommend a PA and lateral chest x-ray for better evaluation. No other acute abnormalities. Electronically Signed   By: Gerome Sam III M.D   On: 03/04/2018 13:44   Dg Pelvis 1-2 Views  Result Date: 03/04/2018 CLINICAL DATA:  Fall.  Left hip pain EXAM: PELVIS - 1-2 VIEW COMPARISON:  07/04/2013 FINDINGS: Left hip replacement in good position and unchanged. No acute fracture or  bone lesion in the pelvis. IMPRESSION: No acute abnormality.  Left hip replacement Electronically Signed   By: Marlan Palau M.D.   On: 03/04/2018 14:15   Dg Abd 1 View  Result Date: 03/05/2018 CLINICAL DATA:  62 year old male with NG tube placement. EXAM: ABDOMEN - 1 VIEW COMPARISON:  Abdominal radiograph dated 03/04/2018 FINDINGS: Partially visualized enteric tube with tip and  side-port in the proximal stomach. Air-filled loops of small bowel noted. Degenerative changes of the spine. IMPRESSION: Enteric tube with tip in the proximal stomach. Electronically Signed   By: Elgie Collard M.D.   On: 03/05/2018 06:38   Ct Head Wo Contrast  Result Date: 03/04/2018 CLINICAL DATA:  Patient found on floor by family. Pain on the left side of head. Altered level of consciousness. EXAM: CT HEAD WITHOUT CONTRAST TECHNIQUE: Contiguous axial images were obtained from the base of the skull through the vertex without intravenous contrast. COMPARISON:  Report from 02/12/1999 FINDINGS: Brain: Age related involutional changes of the brain with mild sulcal and ventricular prominence. Mild-to-moderate small vessel ischemic disease of periventricular white matter. No acute intracranial hemorrhage, midline shift or edema. No intra-axial mass nor extra-axial fluid. Patent basal cisterns. Brainstem and cerebellum appear intact without acute abnormality. Vascular: Atherosclerosis of carotid siphons. No hyperdense vessel sign. Skull: Intact Sinuses/Orbits: No acute finding. Other: Clear mastoids. IMPRESSION: 1. Age related involutional changes of the brain. 2. Likely chronic small vessel ischemic disease periventricular white matter with atherosclerosis at the skull base. 3. No acute intracranial abnormality. Electronically Signed   By: Tollie Eth M.D.   On: 03/04/2018 13:55   US Abdomen Complete  Result Date: 03/05/2018 CLINICAL DATA:  Abdominal pain. EXAM: ABDOMEN ULTRASOUND COMPLETE COMPARISON:  CT of the chest abdomen and pelvis 07/03/2014 FINDINGS: Gallbladder: Innumerable mobile gallstones are identified in the gallbladder, measuring on the order of 7 millimeters in diameter. No sonographic Murphy sign. No pericholecystic fluid. Gallbladder wall is normal in thickness, 1.5 millimeters. Common bile duct: Diameter: 4.0 millimeters Liver: No focal lesion identified. Within normal limits in parenchymal  echogenicity. Portal vein is patent on color Doppler imaging with normal direction of blood flow towards the liver. IVC: No abnormality visualized. Pancreas: Not visualized secondary to bowel gas. Spleen: Size and appearance within normal limits. Right Kidney: Length: 11.9 centimeters. Echogenicity within normal limits. No mass or hydronephrosis visualized. Left Kidney: Length: 11.7 centimeters. Echogenicity within normal limits. No mass or hydronephrosis visualized. Abdominal aorta: Portions of the aorta are obscured by bowel gas. The visualized portion is unremarkable. Other findings: None. IMPRESSION: 1. Cholelithiasis without other ultrasound evidence for acute cholecystitis. 2. No hydronephrosis or acute abnormality. Electronically Signed   By: Norva Pavlov M.D.   On: 03/05/2018 18:13   Ct Hip Left Wo Contrast  Result Date: 03/04/2018 CLINICAL DATA:  Left hip pain after being found on floor. Negative x-rays earlier today. EXAM: CT OF THE LEFT HIP WITHOUT CONTRAST TECHNIQUE: Multidetector CT imaging of the left hip was performed according to the standard protocol. Multiplanar CT image reconstructions were also generated. COMPARISON:  Same day pelvic radiographs FINDINGS: Bones/Joint/Cartilage Intact uncemented left hip arthroplasty without periprosthetic fracture or loosening. No hardware failure. Ligaments Suboptimally assessed by CT. Muscles and Tendons Negative for intramuscular hemorrhage, edema or tear. Soft tissues Negative for hematoma or fluid collection.  No soft tissue mass. IMPRESSION: Intact left hip arthroplasty without acute soft tissue abnormalities noted. Electronically Signed   By: Tollie Eth M.D.   On: 03/04/2018 16:47   [place] Ekg  Site Rite  Result Date: 03/04/2018 If Site Rite image not attached, placement could not be confirmed due to current cardiac rhythm.    Medications:   . dexmedetomidine (PRECEDEX) IV infusion Stopped (03/05/18 1533)  . dextrose Stopped (03/05/18 1306)    . enoxaparin (LOVENOX) injection  40 mg Subcutaneous Q24H  . folic acid  1 mg Oral Daily  . insulin aspart  0-9 Units Subcutaneous TID WC  . LORazepam  0-4 mg Intravenous Q6H   Or  . LORazepam  0-4 mg Oral Q6H  . LORazepam  0-4 mg Intravenous Q12H   Or  . LORazepam  0-4 mg Oral Q12H  . LORazepam  0-4 mg Oral Q6H   Followed by  . LORazepam  0-4 mg Oral Q12H  . multivitamin with minerals  1 tablet Oral Daily  . sodium chloride flush  10-40 mL Intracatheter Q12H  . thiamine  100 mg Oral Daily   Or  . thiamine  100 mg Intravenous Daily   acetaminophen **OR** acetaminophen, HYDROcodone-acetaminophen, LORazepam **OR** LORazepam, ondansetron **OR** ondansetron (ZOFRAN) IV, phenol, polyethylene glycol, sodium chloride flush  Assessment/ Plan:  62 y.o. male with a PMHx of alcohol abuse, who was admitted to [facility] on 03/04/2018 for evaluation of generalized weakness, fall, and suspected alcohol withdrawal.   1.  Hyponatremia. 2.  ETOH abuse. 3.  Hypokalemia. 4.  Anemia unspecified.   Plan:  There is an initial rapid rise in sodium however after starting the patient on D5W we have stabilized serum sodium.  He should likely correct spontaneously however to control the rate of correction we will start the patient back on a lower rate of D5W at 75 cc/h.  Continue to monitor serum sodium closely.  Correction of serum potassium as per pharmacy.  Patient much more awake and alert today as well.   LOS: 2 Geoffrey Solis 12/6/20198:51 AM

## 2018-03-06 NOTE — Progress Notes (Signed)
PT Cancellation Note  Patient Details Name: Gwen PoundsJuan Luciano Tellez MRN: [id number] DOB: 08/17/1955   Cancelled Treatment:    Reason Eval/Treat Not Completed: Medical issues which prohibited therapy(Sodium level remains low).  Will hold PT until pt more medically appropriate.    Encarnacion ChuAshley Abashian PT, DPT 03/06/2018, 8:35 AM

## 2018-03-06 NOTE — Progress Notes (Addendum)
Geoffrey Solis # [id number] interpreter utilized and update pt of the plan for tonight. PT complained of eye pain 7 or 8 decribe " its hurts". On assessment pt eye was red with crust forming (white color) around it. Family at bedside and reported that pt having poor appetite than usual. Will notify the doctor.   Update 2304: Talked to Dr. Caryn BeeMaier and states will place order. Will continue to monitor.  Update 2312: Dr. Caryn BeeMaier ordered trimethoprim-polymyxin b (POLYTRIM) ophthalmic solution 2 drop on both eyes 4 times daily. Will continue to monitor.  Update 0540: Geoffrey Solis [phone] spanish interpreter. Discussed with pt on what the nurse about to do of needing to draw blood from his triple lumen. Pt was refusing to draw blood from his triple lumen and states " He do not need anymore treatment and that's he is fine". Family was called via telephone but pt refused to talk to them. Talked to Geoffrey Solis (daughter) and states that she will come to see his father. Will continue to monitor.   Update 110650: Geoffrey Solis ( daughter ) came and see his dad and talked him about the importance of drawing blood from him and complying with his treatment. Pt agreed to collect the blood and blood sample was sent to Lab. Will continue to monitor.

## 2018-03-07 LAB — BASIC METABOLIC PANEL
Anion gap: 10 (ref 5–15)
BUN: 8 mg/dL (ref 8–23)
CO2: 25 mmol/L (ref 22–32)
Calcium: 9 mg/dL (ref 8.9–10.3)
Chloride: 90 mmol/L — ABNORMAL LOW (ref 98–111)
Creatinine, Ser: 0.64 mg/dL (ref 0.61–1.24)
GFR calc Af Amer: >60 mL/min (ref >60)
GFR calc non Af Amer: >60 mL/min (ref >60)
Glucose, Bld: 144 mg/dL — ABNORMAL HIGH (ref 70–99)
POTASSIUM: 3.9 mmol/L (ref 3.5–5.1)
Sodium: 125 mmol/L — ABNORMAL LOW (ref 135–145)

## 2018-03-07 LAB — SODIUM
SODIUM: 124 mmol/L — AB (ref 135–145)
Sodium: 124 mmol/L — ABNORMAL LOW (ref 135–145)
Sodium: 124 mmol/L — ABNORMAL LOW (ref 135–145)
Sodium: 125 mmol/L — ABNORMAL LOW (ref 135–145)

## 2018-03-07 LAB — GLUCOSE, CAPILLARY
GLUCOSE-CAPILLARY: 128 mg/dL — AB (ref 70–99)
Glucose-Capillary: 152 mg/dL — ABNORMAL HIGH (ref 70–99)
Glucose-Capillary: 160 mg/dL — ABNORMAL HIGH (ref 70–99)
Glucose-Capillary: 143 mg/dL — ABNORMAL HIGH (ref 70–99)
Glucose-Capillary: 151 mg/dL — ABNORMAL HIGH (ref 70–99)

## 2018-03-07 MED ORDER — SODIUM CHLORIDE 0.9 % IV SOLN
INTRAVENOUS | Status: DC
  Administered 2018-03-07: 10:00:00 via INTRAVENOUS

## 2018-03-07 NOTE — Progress Notes (Signed)
Pharmacy Electrolyte Monitoring Consult:  Pharmacy consulted to assist in monitoring and replacing electrolytes in this 62 y.o. male admitted on 03/04/2018 with Shaking and Weakness s/t EtOH abuse and going through w/d and presenting to ED post fall.  Admission Na 113.  Labs:  Sodium (mmol/L)  Date Value  03/07/2018 125 (L)  07/03/2014 135   Potassium (mmol/L)  Date Value  03/07/2018 3.9  07/03/2014 4.0   Magnesium (mg/dL)  Date Value  57/84/696212/07/2017 2.1  02/21/2013 1.8   Phosphorus (mg/dL)  Date Value  95/28/413212/07/2017 2.7   Calcium (mg/dL)  Date Value  44/01/027212/09/2017 9.0   Calcium, Total (mg/dL)  Date Value  53/66/440304/05/2014 9.7   Albumin (g/dL)  Date Value  47/42/595612/06/2017 4.6  07/03/2014 4.7    Assessment: 3% saline was originally started @ 30 ml/hr, but Na has risen to more than 10 mEq/L over the past 12 hours and therefore saline has been stopped and D5 @ 150 ml/hr started s/t to CBG of 99 and possible malnutrition. Nephrology is managing Na, Na is stablizing, stopping D5W and changing Na checks to q4h.  12/06 K 3.3 -- KCl 40mEq PO q4h x 2. Recheck BMET with AM labs.  Plan:  12/7 K 3.9 - no supp needed at this time.  Recheck in AM, if K stable may sign off.    Crist FatHannah Taha Dimond, PharmD, BCPS Clinical Pharmacist 03/07/2018 9:05 AM

## 2018-03-07 NOTE — Progress Notes (Signed)
[employer] [facility] [place] [facility]   PATIENT NAME: Geoffrey Solis    MR#:  [id number]  DATE OF BIRTH:  22-Jun-1955  SUBJECTIVE:  Used Spanish interpreter Casimiro Needle through language line.  Patient complains of left upper quadrant pain.  Was denies any complaints.  No shaking.  CIWA score 2.  CHIEF COMPLAINT:   Chief Complaint  Patient presents with  . Shaking  . Weakness  Patient seen today  Awake and responds to all verbal commands   REVIEW OF SYSTEMS:    Review of Systems  Constitutional: Negative for chills and fever.  HENT: Negative for hearing loss.   Eyes: Negative for blurred vision, double vision and photophobia.  Respiratory: Negative for cough, hemoptysis and shortness of breath.   Cardiovascular: Negative for palpitations, orthopnea and leg swelling.  Gastrointestinal: Negative for abdominal pain, diarrhea and vomiting.  Genitourinary: Negative for dysuria and urgency.  Musculoskeletal: Negative for myalgias and neck pain.  Skin: Negative for rash.  Neurological: Negative for dizziness, focal weakness, seizures, weakness and headaches.  Psychiatric/Behavioral: Negative for memory loss. The patient does not have insomnia.    Physical exam;   HEENT: Head atraumatic, normocephalic. Oropharynx and nasopharynx clear.  NECK:  Supple, no jugular venous distention. No thyroid enlargement, no tenderness.  LUNGS: Normal breath sounds bilaterally, no wheezing, rales, rhonchi. No use of accessory muscles of respiration.  CARDIOVASCULAR: S1, S2 normal. No murmurs, rubs, or gallops.  ABDOMEN: Soft, nontender, nondistended. Bowel sounds present. No organomegaly or mass.  EXTREMITIES: No cyanosis, clubbing or edema b/l.    NEUROLOGIC: Cranial nerves II through XII intact Sensory system intact Power 5 x 5 all extremities No cerebellar signs noted PSYCHIATRIC: The patient is alert and oriented x 3 SKIN: No obvious rash, lesion, or ulcer.   LABORATORY PANEL:    CBC Recent Labs  Lab 03/05/18 0452  WBC 4.9  HGB 9.6*  HCT 26.8*  PLT 210   ------------------------------------------------------------------------------------------------------------------ Chemistries  Recent Labs  Lab 03/04/18 1301  03/05/18 0945  03/07/18 0610  NA 113*   < > 126*   < > 125*  K 3.4*   < >  --    < > 3.9  CL 74*   < >  --    < > 90*  CO2 18*   < >  --    < > 25  GLUCOSE 133*   < >  --    < > 144*  BUN 14   < >  --    < > 8  CREATININE 1.11   < >  --    < > 0.64  CALCIUM 8.9   < >  --    < > 9.0  MG  --   --  2.1  --   --   AST 89*  --   --   --   --   ALT 44  --   --   --   --   ALKPHOS 58  --   --   --   --   BILITOT 2.2*  --   --   --   --    < > = values in this interval not displayed.   ------------------------------------------------------------------------------------------------------------------  Cardiac Enzymes Recent Labs  Lab 03/04/18 1301  TROPONINI <0.03   ------------------------------------------------------------------------------------------------------------------  RADIOLOGY:  US Abdomen Complete  Result Date: 03/05/2018 CLINICAL DATA:  Abdominal pain. EXAM: ABDOMEN ULTRASOUND COMPLETE COMPARISON:  CT of the chest abdomen and pelvis  07/03/2014 FINDINGS: Gallbladder: Innumerable mobile gallstones are identified in the gallbladder, measuring on the order of 7 millimeters in diameter. No sonographic Murphy sign. No pericholecystic fluid. Gallbladder wall is normal in thickness, 1.5 millimeters. Common bile duct: Diameter: 4.0 millimeters Liver: No focal lesion identified. Within normal limits in parenchymal echogenicity. Portal vein is patent on color Doppler imaging with normal direction of blood flow towards the liver. IVC: No abnormality visualized. Pancreas: Not visualized secondary to bowel gas. Spleen: Size and appearance within normal limits. Right Kidney: Length: 11.9 centimeters. Echogenicity within normal limits. No mass or  hydronephrosis visualized. Left Kidney: Length: 11.7 centimeters. Echogenicity within normal limits. No mass or hydronephrosis visualized. Abdominal aorta: Portions of the aorta are obscured by bowel gas. The visualized portion is unremarkable. Other findings: None. IMPRESSION: 1. Cholelithiasis without other ultrasound evidence for acute cholecystitis. 2. No hydronephrosis or acute abnormality. Electronically Signed   By: Norva PavlovElizabeth  Brown M.D.   On: 03/05/2018 18:13     ASSESSMENT AND PLAN:   62 year old male patient with history of ETOH  abuse and dependence admitted to ICU due to mechanical fall, required Precedex drip for delirium tremens, transferred to floor. -#1 acute hyponatremia; sodium stable around 125 but still low Off 3% hypertonic saline, continue normal saline at 50 mL/h Status post nephrology evaluation Probably secondary to alcohol abuse  -Elevated CK levels Rhabdomyolysis Improving with IV fluids  -Alcohol withdrawal/alcohol abuse Continue CIWA protocol, use Ativan.  Received a Precedex drip in ICU.  -Alcoholic ketoacidosis improved IV fluids  -Mechanical fall and left hip pain No evidence of fracture on CT hip Intact left hip arthroplasty Consult physical therapy. -Acute hypokalemia ;improved  -Anemia appears chronic No evidence of active GI bleed  Diabetes mellitus type 2,; was on metformin at home but not given due to acute illness, continue sliding scale insulin with coverage, seen by diabetic nurse, recommended NovoLog 0 to 5 units nightly for bedtime, check hemoglobin A1c.  All the records are reviewed and case discussed with Care Management/Social Worker. Management plans discussed with the patient, family and they are in agreement.  CODE STATUS: Full code  DVT Prophylaxis: SCDs  TOTAL TIME TAKING CARE OF THIS PATIENT: 33 minutes.   POSSIBLE D/C IN 3 to 4 DAYS, DEPENDING ON CLINICAL CONDITION. More than 50% time spent in counseling, coordination of  care  Used Spanish interpreter through language line. Katha HammingSnehalatha Deshanae Lindo M.D on 03/07/2018 at 9:14 AM  Between 7am to 6pm - Pager - [phone]  After 6pm go to [email or web address] - password [email or web address]  [facility] [place] [facility]  Office  [phone]  CC: Primary care physician; Patient, No Pcp Per  Note: This dictation was prepared with Dragon dictation along with smaller phrase technology. Any transcriptional errors that result from this process are unintentional.

## 2018-03-07 NOTE — Plan of Care (Signed)
  Problem: Clinical Measurements: Goal: Respiratory complications will improve Outcome: Progressing Note:  Pt sat at 100% RA   Problem: Pain Managment: Goal: General experience of comfort will improve Outcome: Progressing   Problem: Safety: Goal: Ability to remain free from injury will improve Outcome: Progressing

## 2018-03-07 NOTE — Plan of Care (Signed)

## 2018-03-07 NOTE — Progress Notes (Signed)
Patient's temperature 100.4, administered prn tylenol. Will give report to oncoming nurse to reassess.

## 2018-03-07 NOTE — Progress Notes (Signed)
[facility]  ROUNDING NOTE   Subjective:  Patient seen at bedside. Serum sodium up to 125. Patient now started on normal saline.    Objective:  Vital signs in last 24 hours:  Temp:  [97.8 F (36.6 C)-99.8 F (37.7 C)] 97.8 F (36.6 C) (12/07 1021) Pulse Rate:  [89-115] 89 (12/07 1021) Resp:  [16-22] 16 (12/07 1021) BP: (112-179)/(63-94) 130/88 (12/07 1021) SpO2:  [99 %-100 %] 100 % (12/07 1021)  Weight change:  Filed Weights   03/04/18 1256 03/04/18 2147  Weight: 65 kg 66.1 kg    Intake/Output: I/O last 3 completed shifts: In: 1500.9 [I.V.:994.6; IV Piggyback:506.3] Out: 4600 [Urine:4600]   Intake/Output this shift:  Total I/O In: 63 [P.O.:60; I.V.:3] Out: 200 [Urine:200]  Physical Exam: General: No acute distress  Head: Normocephalic, atraumatic. Moist oral mucosal membranes  Eyes: Anicteric  Neck: Supple, trachea midline  Lungs:  Clear to auscultation, normal effort  Heart: S1S2 no rubs  Abdomen:  Soft, nontender, bowel sounds present  Extremities: No peripheral edema.  Neurologic: Awake, alert, following commands  Skin: No lesions       Basic Metabolic Panel: Recent Labs  Lab 03/04/18 1301  03/05/18 0452  03/05/18 0945 03/05/18 1141  03/05/18 2254  03/06/18 0507  03/06/18 1215  03/06/18 1746 03/06/18 2117 03/07/18 0100 03/07/18 0610 03/07/18 1033  NA 113*   < > 127*   < > 126* 129*   < > 121*   < > 121*   < > 120*   < > 122* 123* 124* 125* 124*  K 3.4*  --  2.8*  --   --  2.9*  --  3.6  --  3.6  --  3.3*  --   --   --   --  3.9  --   CL 74*  --  93*  --   --  95*  --   --   --  92*  --   --   --   --   --   --  90*  --   CO2 18*  --  24  --   --  24  --   --   --  24  --   --   --   --   --   --  25  --   GLUCOSE 133*  --  189*  --   --  66*  --   --   --  176*  --   --   --   --   --   --  144*  --   BUN 14  --  16  --   --  14  --   --   --  9  --   --   --   --   --   --  8  --   CREATININE 1.11  --  0.79  --   --  0.78  --    --   --  0.53*  --   --   --   --   --   --  0.64  --   CALCIUM 8.9  --  8.6*  --   --  8.9  --   --   --  8.2*  --   --   --   --   --   --  9.0  --   MG  --   --   --   --  2.1  --   --   --   --   --   --   --   --   --   --   --   --   --   PHOS  --   --   --   --  2.7  --   --   --   --   --   --   --   --   --   --   --   --   --    < > = values in this interval not displayed.    Liver Function Tests: Recent Labs  Lab 03/04/18 1301  AST 89*  ALT 44  ALKPHOS 58  BILITOT 2.2*  PROT 7.5  ALBUMIN 4.6   Recent Labs  Lab 03/05/18 1809  LIPASE 26  AMYLASE 57   Recent Labs  Lab 03/04/18 1302  AMMONIA 34    CBC: Recent Labs  Lab 03/04/18 1301 03/05/18 0452  WBC 12.9* 4.9  NEUTROABS 10.9*  --   HGB 11.1* 9.6*  HCT 30.1* 26.8*  MCV 86.7 88.7  PLT 284 210    Cardiac Enzymes: Recent Labs  Lab 03/04/18 1301 03/04/18 2152 03/06/18 0507  CKTOTAL  --  2,429* 582*  TROPONINI <0.03  --   --     BNP: Invalid input(s): POCBNP  CBG: Recent Labs  Lab 03/06/18 1711 03/06/18 2107 03/07/18 0809 03/07/18 0951 03/07/18 1142  GLUCAP 170* 151* 152* 160* 143*    Microbiology: Results for orders placed or performed during the hospital encounter of 03/04/18  Blood culture (routine x 2)     Status: None (Preliminary result)   Collection Time: 03/04/18  1:02 PM  Result Value Ref Range Status   Specimen Description BLOOD RIGHT [facility]  Final   Special Requests   Final    BOTTLES DRAWN AEROBIC AND ANAEROBIC Blood Culture adequate volume   Culture   Final    NO GROWTH 3 DAYS Performed at [facility], [address], [place], [place] [address]    Report Status PENDING  Incomplete  Blood culture (routine x 2)     Status: None (Preliminary result)   Collection Time: 03/04/18  1:02 PM  Result Value Ref Range Status   Specimen Description BLOOD LEFT AC  Final   Special Requests   Final    BOTTLES DRAWN AEROBIC AND ANAEROBIC Blood Culture results may not be  optimal due to an excessive volume of blood received in culture bottles   Culture   Final    NO GROWTH 3 DAYS Performed at [facility], [address], [place], [place] [address]    Report Status PENDING  Incomplete  MRSA PCR Screening     Status: None   Collection Time: 03/04/18  9:39 PM  Result Value Ref Range Status   MRSA by PCR NEGATIVE NEGATIVE Final    Comment:        The GeneXpert MRSA Assay (FDA approved for NASAL specimens only), is one component of a comprehensive MRSA colonization surveillance program. It is not intended to diagnose MRSA infection nor to guide or monitor treatment for MRSA infections. Performed at [facility], [address] [place], [place] [address]     Coagulation Studies: No results for input(s): LABPROT, INR in the last 72 hours.  Urinalysis: No results for input(s): COLORURINE, LABSPEC, PHURINE, GLUCOSEU, HGBUR, BILIRUBINUR, KETONESUR, PROTEINUR, UROBILINOGEN, NITRITE, LEUKOCYTESUR in the last 72 hours.  Invalid input(s): APPERANCEUR    Imaging: US Abdomen Complete  Result Date: 03/05/2018 CLINICAL DATA:  Abdominal pain. EXAM: ABDOMEN ULTRASOUND COMPLETE COMPARISON:  CT of the chest abdomen and pelvis 07/03/2014 FINDINGS: Gallbladder: Innumerable mobile gallstones are identified in the gallbladder, measuring on the order of 7 millimeters in diameter. No sonographic Murphy sign. No pericholecystic fluid. Gallbladder wall is normal in thickness, 1.5 millimeters. Common bile duct: Diameter: 4.0 millimeters Liver: No focal lesion identified. Within normal limits in parenchymal echogenicity. Portal vein is patent on color Doppler imaging with normal direction of blood flow towards the liver. IVC: No abnormality visualized. Pancreas: Not visualized secondary to bowel gas. Spleen: Size and appearance within normal limits. Right Kidney: Length: 11.9 centimeters. Echogenicity within normal limits. No mass or hydronephrosis  visualized. Left Kidney: Length: 11.7 centimeters. Echogenicity within normal limits. No mass or hydronephrosis visualized. Abdominal aorta: Portions of the aorta are obscured by bowel gas. The visualized portion is unremarkable. Other findings: None. IMPRESSION: 1. Cholelithiasis without other ultrasound evidence for acute cholecystitis. 2. No hydronephrosis or acute abnormality. Electronically Signed   By: Norva Pavlov M.D.   On: 03/05/2018 18:13     Medications:   . sodium chloride 50 mL/hr at 03/07/18 0954   . enoxaparin (LOVENOX) injection  40 mg Subcutaneous Q24H  . folic acid  1 mg Oral Daily  . insulin aspart  0-9 Units Subcutaneous TID WC  . LORazepam  0-4 mg Intravenous Q12H   Or  . LORazepam  0-4 mg Oral Q12H  . multivitamin with minerals  1 tablet Oral Daily  . sodium chloride flush  10-40 mL Intracatheter Q12H  . thiamine  100 mg Oral Daily   Or  . thiamine  100 mg Intravenous Daily  . trimethoprim-polymyxin b  2 drop Both Eyes QID   acetaminophen **OR** acetaminophen, HYDROcodone-acetaminophen, LORazepam **OR** LORazepam, ondansetron **OR** ondansetron (ZOFRAN) IV, phenol, polyethylene glycol, sodium chloride flush  Assessment/ Plan:  62 y.o. male with a PMHx of alcohol abuse, who was admitted to [facility] on 03/04/2018 for evaluation of generalized weakness, fall, and suspected alcohol withdrawal.   1.  Hyponatremia. 2.  ETOH abuse. 3.  Hypokalemia. 4.  Anemia unspecified.   Plan:  Serum sodium currently 124.  Start the patient on 0.9 normal saline 50 cc/h.  Serum potassium improved to 3.9.  Hemoglobin still low at 9.6.  We will continue to monitor serum sodium periodically.  Further plan as patient progresses.   LOS: 3 Geoffrey Solis 12/7/20191:33 PM

## 2018-03-07 NOTE — Progress Notes (Signed)
PT Cancellation Note  Patient Details Name: Geoffrey Solis MRN: [id number] DOB: 12/24/1955   Cancelled Treatment:    Reason Eval/Treat Not Completed: Medical issues which prohibited therapy   Ezekiel InaMansfield, Zaccai Chavarin S, PT DPT 03/07/2018, 11:16 AM

## 2018-03-08 LAB — GLUCOSE, CAPILLARY
Glucose-Capillary: 133 mg/dL — ABNORMAL HIGH (ref 70–99)
Glucose-Capillary: 160 mg/dL — ABNORMAL HIGH (ref 70–99)
Glucose-Capillary: 146 mg/dL — ABNORMAL HIGH (ref 70–99)
Glucose-Capillary: 181 mg/dL — ABNORMAL HIGH (ref 70–99)

## 2018-03-08 LAB — HEMOGLOBIN A1C
Hgb A1c MFr Bld: 6.2 % — ABNORMAL HIGH (ref 4.8–5.6)
Hgb A1c MFr Bld: 6.2 % — ABNORMAL HIGH (ref 4.8–5.6)
Mean Plasma Glucose: 131.24 mg/dL
Mean Plasma Glucose: 131.24 mg/dL

## 2018-03-08 LAB — BASIC METABOLIC PANEL
Anion gap: 9 (ref 5–15)
BUN: 15 mg/dL (ref 8–23)
CO2: 26 mmol/L (ref 22–32)
Calcium: 8.9 mg/dL (ref 8.9–10.3)
Chloride: 94 mmol/L — ABNORMAL LOW (ref 98–111)
Creatinine, Ser: 0.64 mg/dL (ref 0.61–1.24)
GFR calc Af Amer: >60 mL/min (ref >60)
GFR calc non Af Amer: >60 mL/min (ref >60)
GLUCOSE: 131 mg/dL — AB (ref 70–99)
Potassium: 3.8 mmol/L (ref 3.5–5.1)
Sodium: 129 mmol/L — ABNORMAL LOW (ref 135–145)

## 2018-03-08 LAB — SODIUM: Sodium: 126 mmol/L — ABNORMAL LOW (ref 135–145)

## 2018-03-08 MED ORDER — PNEUMOCOCCAL VAC POLYVALENT 25 MCG/0.5ML IJ INJ
0.5000 mL | INJECTION | INTRAMUSCULAR | Status: AC
  Administered 2018-03-09: 13:00:00 0.5 mL via INTRAMUSCULAR
  Filled 2018-03-08: qty 0.5

## 2018-03-08 NOTE — Progress Notes (Signed)
[facility]  ROUNDING NOTE   Subjective:  Overall patient significantly improved. Serum sodium up to 129. Eating lunch at the moment.  Objective:  Vital signs in last 24 hours:  Temp:  [98.1 F (36.7 C)-100.4 F (38 C)] 98.1 F (36.7 C) (12/08 0449) Pulse Rate:  [96-120] 96 (12/08 0449) Resp:  [16-19] 17 (12/08 0449) BP: (119-148)/(77-92) 135/80 (12/08 0449) SpO2:  [98 %-100 %] 100 % (12/08 0449)  Weight change:  Filed Weights   03/04/18 1256 03/04/18 2147  Weight: 65 kg 66.1 kg    Intake/Output: I/O last 3 completed shifts: In: 847.8 [P.O.:60; I.V.:787.8] Out: 1275 [Urine:1275]   Intake/Output this shift:  Total I/O In: 290.4 [I.V.:290.4] Out: 775 [Urine:775]  Physical Exam: General: No acute distress  Head: Normocephalic, atraumatic. Moist oral mucosal membranes  Eyes: Anicteric  Neck: Supple, trachea midline  Lungs:  Clear to auscultation, normal effort  Heart: S1S2 no rubs  Abdomen:  Soft, nontender, bowel sounds present  Extremities: No peripheral edema.  Neurologic: Awake, alert, following commands  Skin: No lesions       Basic Metabolic Panel: Recent Labs  Lab 03/05/18 0452  03/05/18 0945 03/05/18 1141  03/05/18 2254  03/06/18 0507  03/06/18 1215  03/07/18 0610 03/07/18 1033 03/07/18 1542 03/07/18 2230 03/08/18 0150 03/08/18 0541  NA 127*   < > 126* 129*   < > 121*   < > 121*   < > 120*   < > 125* 124* 124* 125* 126* 129*  K 2.8*  --   --  2.9*  --  3.6  --  3.6  --  3.3*  --  3.9  --   --   --   --  3.8  CL 93*  --   --  95*  --   --   --  92*  --   --   --  90*  --   --   --   --  94*  CO2 24  --   --  24  --   --   --  24  --   --   --  25  --   --   --   --  26  GLUCOSE 189*  --   --  66*  --   --   --  176*  --   --   --  144*  --   --   --   --  131*  BUN 16  --   --  14  --   --   --  9  --   --   --  8  --   --   --   --  15  CREATININE 0.79  --   --  0.78  --   --   --  0.53*  --   --   --  0.64  --   --   --   --   0.64  CALCIUM 8.6*  --   --  8.9  --   --   --  8.2*  --   --   --  9.0  --   --   --   --  8.9  MG  --   --  2.1  --   --   --   --   --   --   --   --   --   --   --   --   --   --  PHOS  --   --  2.7  --   --   --   --   --   --   --   --   --   --   --   --   --   --    < > = values in this interval not displayed.    Liver Function Tests: Recent Labs  Lab 03/04/18 1301  AST 89*  ALT 44  ALKPHOS 58  BILITOT 2.2*  PROT 7.5  ALBUMIN 4.6   Recent Labs  Lab 03/05/18 1809  LIPASE 26  AMYLASE 57   Recent Labs  Lab 03/04/18 1302  AMMONIA 34    CBC: Recent Labs  Lab 03/04/18 1301 03/05/18 0452  WBC 12.9* 4.9  NEUTROABS 10.9*  --   HGB 11.1* 9.6*  HCT 30.1* 26.8*  MCV 86.7 88.7  PLT 284 210    Cardiac Enzymes: Recent Labs  Lab 03/04/18 1301 03/04/18 2152 03/06/18 0507  CKTOTAL  --  2,429* 582*  TROPONINI <0.03  --   --     BNP: Invalid input(s): POCBNP  CBG: Recent Labs  Lab 03/07/18 1142 03/07/18 1653 03/07/18 2155 03/08/18 0804 03/08/18 1131  GLUCAP 143* 151* 128* 133* 160*    Microbiology: Results for orders placed or performed during the hospital encounter of 03/04/18  Blood culture (routine x 2)     Status: None (Preliminary result)   Collection Time: 03/04/18  1:02 PM  Result Value Ref Range Status   Specimen Description BLOOD RIGHT [facility]  Final   Special Requests   Final    BOTTLES DRAWN AEROBIC AND ANAEROBIC Blood Culture adequate volume   Culture   Final    NO GROWTH 4 DAYS Performed at [facility], [address], [place], [place] [address]    Report Status PENDING  Incomplete  Blood culture (routine x 2)     Status: None (Preliminary result)   Collection Time: 03/04/18  1:02 PM  Result Value Ref Range Status   Specimen Description BLOOD LEFT AC  Final   Special Requests   Final    BOTTLES DRAWN AEROBIC AND ANAEROBIC Blood Culture results may not be optimal due to an excessive volume of blood received in culture bottles    Culture   Final    NO GROWTH 4 DAYS Performed at [facility], [address] [place], [place] [address]    Report Status PENDING  Incomplete  MRSA PCR Screening     Status: None   Collection Time: 03/04/18  9:39 PM  Result Value Ref Range Status   MRSA by PCR NEGATIVE NEGATIVE Final    Comment:        The GeneXpert MRSA Assay (FDA approved for NASAL specimens only), is one component of a comprehensive MRSA colonization surveillance program. It is not intended to diagnose MRSA infection nor to guide or monitor treatment for MRSA infections. Performed at [facility], [address] [place], [place] [address]     Coagulation Studies: No results for input(s): LABPROT, INR in the last 72 hours.  Urinalysis: No results for input(s): COLORURINE, LABSPEC, PHURINE, GLUCOSEU, HGBUR, BILIRUBINUR, KETONESUR, PROTEINUR, UROBILINOGEN, NITRITE, LEUKOCYTESUR in the last 72 hours.  Invalid input(s): APPERANCEUR    Imaging: No results found.   Medications:    . enoxaparin (LOVENOX) injection  40 mg Subcutaneous Q24H  . folic acid  1 mg Oral Daily  . insulin aspart  0-9 Units Subcutaneous TID WC  .  multivitamin with minerals  1 tablet Oral Daily  . sodium chloride flush  10-40 mL Intracatheter Q12H  . thiamine  100 mg Oral Daily   Or  . thiamine  100 mg Intravenous Daily  . trimethoprim-polymyxin b  2 drop Both Eyes QID   acetaminophen **OR** acetaminophen, HYDROcodone-acetaminophen, ondansetron **OR** ondansetron (ZOFRAN) IV, phenol, polyethylene glycol, sodium chloride flush  Assessment/ Plan:  62 y.o. male with a PMHx of alcohol abuse, who [facility] for evaluation of generalized weakness, fall, and suspected alcohol withdrawal.   1.  Hyponatremia. 2.  ETOH abuse. 3.  Hypokalemia. 4.  Anemia unspecified.   Plan:  Serum sodium now up to 129.  He is now eating a regular diet.  Therefore sodium should correct on its own now.   Hypokalemia also resolved with a serum potassium of 3.8.   LOS: 4 Javyn Havlin 12/8/20191:32 PM

## 2018-03-08 NOTE — Evaluation (Signed)
Physical Therapy Evaluation Patient Details Name: Geoffrey Solis MRN: [id number] DOB: Sep 07, 1955 Today's Date: 03/08/2018   History of Present Illness  62 yo male with Spanish language interpreter needed was admitted for hyponatremia with rhabdomyolysis, ketoacidosis and CIWA protocol needed.  Had fallen at home and found by family, cleared for fracture and repleted sodium.  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  He is very motivated and via translation stated several times, "I can walk".  Pt is complaining of some L hip pain from a fall at home prior to his arrival at hosp so will add a cane to walk next PT visit.  If helpful will send one home with pt.  Follow acutely for these needs and to add strengthening to LE's esp L hip.    Follow Up Recommendations Home health PT;Supervision for mobility/OOB    Equipment Recommendations  Cane    Recommendations for Other Services       Precautions / Restrictions Precautions Precautions: Fall Precaution Comments: CIWA Restrictions Weight Bearing Restrictions: No      Mobility  Bed Mobility Overal bed mobility: Modified Independent                Transfers Overall transfer level: Modified independent Equipment used: None                Ambulation/Gait Ambulation/Gait assistance: Min guard Gait Distance (Feet): 300 Feet Assistive device: None Gait Pattern/deviations: Step-through pattern;Wide base of support;Trunk flexed Gait velocity: reduced Gait velocity interpretation: <1.31 ft/sec, indicative of household ambulator    Information systems manager [place] (Stroke Patients Only)       Balance Overall balance assessment: Needs assistance Sitting-balance support: Feet supported Sitting balance-Leahy  Scale: Good     Standing balance support: No upper extremity supported Standing balance-Leahy Scale: Fair                               Pertinent Vitals/Pain Pain Assessment: 0-10 Pain Score: 7  Pain Location: L hip with longer gait trip Pain Descriptors / Indicators: Grimacing Pain Intervention(s): Monitored during session;Repositioned    Home Living Family/patient expects to be discharged to:: Private residence Living Arrangements: Spouse/significant other;Children Available Help at Discharge: Family;Available PRN/intermittently Type of Home: House Home Access: Stairs to enter Entrance Stairs-Rails: Doctor, general practice of Steps: 2 Home Layout: One level Home Equipment: None      Prior Function Level of Independence: Independent               Hand Dominance   Dominant Hand: Right    Extremity/Trunk Assessment   Upper Extremity Assessment Upper Extremity Assessment: Overall WFL for tasks assessed    Lower Extremity Assessment Lower Extremity Assessment: LLE deficits/detail LLE Deficits / Details: reluctant to move much but can use for gait LLE Coordination: decreased gross motor    Cervical / Trunk Assessment Cervical / Trunk Assessment: Normal  Communication   Communication: Prefers language other than [place];Other (comment);Interpreter utilized(repeated I can walk mult times to translator)  Cognition Arousal/Alertness: Awake/alert Behavior During Therapy: WFL for tasks assessed/performed Overall Cognitive Status: Within Functional Limits for tasks assessed  General Comments      Exercises     Assessment/Plan    PT Assessment Patient needs continued PT services  PT Problem List Decreased strength;Decreased range of motion;Decreased activity tolerance;Decreased balance;Decreased mobility;Decreased coordination;Decreased knowledge of use of DME;Decreased safety  awareness;Pain       PT Treatment Interventions DME instruction;Gait training;Functional mobility training;Therapeutic activities;Therapeutic exercise;Balance training;Neuromuscular re-education;Patient/family education    PT Goals (Current goals can be found in the Care Plan section)  Acute Rehab PT Goals Patient Stated Goal: to get home PT Goal Formulation: With patient Time For Goal Achievement: 03/15/18 Potential to Achieve Goals: Good    Frequency Min 2X/week   Barriers to discharge Decreased caregiver support home alone at times as wife is working    [employer]-evaluation               AM-PAC PT "6 Clicks" Mobility  Outcome Measure Help needed turning from your back to your side while in a flat bed without using bedrails?: None Help needed moving from lying on your back to sitting on the side of a flat bed without using bedrails?: A Little Help needed moving to and from a bed to a chair (including a wheelchair)?: A Little Help needed standing up from a chair using your arms (e.g., wheelchair or bedside chair)?: A Little Help needed to walk in hospital room?: A Little Help needed climbing 3-5 steps with a railing? : Total 6 Click Score: 17    End of Session Equipment Utilized During Treatment: Gait belt Activity Tolerance: Patient tolerated treatment well Patient left: in bed;with call bell/phone within reach;with bed alarm set Nurse Communication: Mobility status PT Visit Diagnosis: Unsteadiness on feet (R26.81);Muscle weakness (generalized) (M62.81);Difficulty in walking, not elsewhere classified (R26.2);Pain Pain - Right/Left: Left Pain - part of body: Hip    Time: [id number] PT Time Calculation (min) (ACUTE ONLY): 27 min   Charges:   PT Evaluation $PT Eval Moderate Complexity: 1 Mod PT Treatments $Gait Training: 8-22 mins        Ivar DrapeRuth E Jamaris Theard 03/08/2018, 10:43 PM   Samul Dadauth Pookela Sellin, PT MS Acute Rehab Dept. Number: [facility] [phone] and [facility] [phone]

## 2018-03-08 NOTE — Progress Notes (Signed)
[employer] - [place] at [facility]   PATIENT NAME: Geoffrey Solis    MR#:  [id number]  DATE OF BIRTH:  03-18-1956    CHIEF COMPLAINT:   Chief Complaint  Patient presents with  . Shaking  . Weakness  Used Spanish interpreter through language line.  Patient said that he watches TV all night.  Denies any other complaints wants to go home tomorrow.  Did not work with physical therapy yet. No complaints., had low-grade fever yesterday. REVIEW OF SYSTEMS:    Review of Systems  Constitutional: Negative for chills and fever.  HENT: Negative for hearing loss.   Eyes: Negative for blurred vision, double vision and photophobia.  Respiratory: Negative for cough, hemoptysis and shortness of breath.   Cardiovascular: Negative for palpitations, orthopnea and leg swelling.  Gastrointestinal: Negative for abdominal pain, diarrhea and vomiting.  Genitourinary: Negative for dysuria and urgency.  Musculoskeletal: Negative for myalgias and neck pain.  Skin: Negative for rash.  Neurological: Negative for dizziness, focal weakness, seizures, weakness and headaches.  Psychiatric/Behavioral: Negative for memory loss. The patient does not have insomnia.    Physical exam;   HEENT: Head atraumatic, normocephalic. Oropharynx and nasopharynx clear.  NECK:  Supple, no jugular venous distention. No thyroid enlargement, no tenderness.  LUNGS: Normal breath sounds bilaterally, no wheezing, rales, rhonchi. No use of accessory muscles of respiration.  CARDIOVASCULAR: S1, S2 normal. No murmurs, rubs, or gallops.  ABDOMEN: Soft, nontender, nondistended. Bowel sounds present. No organomegaly or mass.  EXTREMITIES: No cyanosis, clubbing or edema b/l.    NEUROLOGIC: Cranial nerves II through XII intact Sensory system intact Power 5 x 5 all extremities No cerebellar signs noted PSYCHIATRIC: The patient is alert and oriented x 3 SKIN: No obvious rash, lesion, or ulcer.   LABORATORY PANEL:    CBC Recent Labs  Lab 03/05/18 0452  WBC 4.9  HGB 9.6*  HCT 26.8*  PLT 210   ------------------------------------------------------------------------------------------------------------------ Chemistries  Recent Labs  Lab 03/04/18 1301  03/05/18 0945  03/08/18 0541  NA 113*   < > 126*   < > 129*  K 3.4*   < >  --    < > 3.8  CL 74*   < >  --    < > 94*  CO2 18*   < >  --    < > 26  GLUCOSE 133*   < >  --    < > 131*  BUN 14   < >  --    < > 15  CREATININE 1.11   < >  --    < > 0.64  CALCIUM 8.9   < >  --    < > 8.9  MG  --   --  2.1  --   --   AST 89*  --   --   --   --   ALT 44  --   --   --   --   ALKPHOS 58  --   --   --   --   BILITOT 2.2*  --   --   --   --    < > = values in this interval not displayed.   ------------------------------------------------------------------------------------------------------------------  Cardiac Enzymes Recent Labs  Lab 03/04/18 1301  TROPONINI <0.03   ------------------------------------------------------------------------------------------------------------------  RADIOLOGY:  No results found.   ASSESSMENT AND PLAN:   62 year old male patient with history of ETOH  abuse and dependence admitted to ICU due to mechanical  fall, required Precedex drip for delirium tremens, transferred to floor.   -#1 .acute hyponatremia; causing fall, likely due to beer poto mania: received 3% sodium.  Sodium improved from 1 18-1 29.  Patient is alert, awake today, discontinue IV fluids and watch how he does.  Patient will likely be discharged home tomorrow.  -Elevated CK levels Rhabdomyolysis Improving with IV fluids  -Alcohol withdrawal/alcohol abuse Continue CIWA protocol, use Ativan.  Received  Precedex drip in ICU.  -Alcoholic ketoacidosis improved   -Mechanical fall and left hip pain No evidence of fracture on CT hip Intact left hip arthroplasty Consult physical therapy.Marland Kitchen. Appreciate physical therapy seeing today.  -Acute  hypokalemia ;improved  -Anemia appears chronic No evidence of active GI bleed  Diabetes mellitus type 2,; continue insulin sliding scale coverage, resume metformin at discharge, Likely discharge home tomorrow. All the records are reviewed and case discussed with Care Management/Social Worker. Management plans discussed with the patient, family and they are in agreement.  CODE STATUS: Full code  DVT Prophylaxis: SCDs  TOTAL TIME TAKING CARE OF THIS PATIENT: 33 minutes.   POSSIBLE D/C IN 3 to 4 DAYS, DEPENDING ON CLINICAL CONDITION. More than 50% time spent in counseling, coordination of care  Used Spanish interpreter through language line. Katha HammingSnehalatha Loza Prell M.D on 03/08/2018 at 9:27 AM  Between 7am to 6pm - Pager - [phone]  After 6pm go to [email or web address] - password [email or web address]  [facility] [place] [facility]  Office  [phone]  CC: Primary care physician; Patient, No Pcp Per  Note: This dictation was prepared with Dragon dictation along with smaller phrase technology. Any transcriptional errors that result from this process are unintentional.

## 2018-03-08 NOTE — Progress Notes (Signed)
Pharmacy Electrolyte Monitoring Consult:  Pharmacy consulted to assist in monitoring and replacing electrolytes in this 62 y.o. male admitted on 03/04/2018 with Shaking and Weakness s/t EtOH abuse and going through w/d and presenting to ED post fall.  Admission Na 113.  Labs:  Sodium (mmol/L)  Date Value  03/08/2018 129 (L)  07/03/2014 135   Potassium (mmol/L)  Date Value  03/08/2018 3.8  07/03/2014 4.0   Magnesium (mg/dL)  Date Value  16/10/960412/07/2017 2.1  02/21/2013 1.8   Phosphorus (mg/dL)  Date Value  54/09/811912/07/2017 2.7   Calcium (mg/dL)  Date Value  14/78/295612/10/2017 8.9   Calcium, Total (mg/dL)  Date Value  21/30/865704/05/2014 9.7   Albumin (g/dL)  Date Value  84/69/629512/06/2017 4.6  07/03/2014 4.7    Assessment: 3% saline was originally started @ 30 ml/hr, but Na has risen to more than 10 mEq/L over the past 12 hours and therefore saline has been stopped and D5 @ 150 ml/hr started s/t to CBG of 99 and possible malnutrition. Nephrology is managing Na, Na is stablizing, stopping D5W and changing Na checks to q4h.  12/06 K 3.3 -- KCl 40mEq PO q4h x 2. Recheck BMET with AM labs. 12/7 K 3.9 - no supp needed at this time.   Plan:  12/8 K 3.8 - no supp needed at this time.  K stable will sign off.    Crist FatHannah Lugenia Assefa, PharmD, BCPS Clinical Pharmacist 03/08/2018 10:00 AM

## 2018-03-09 LAB — GLUCOSE, CAPILLARY
GLUCOSE-CAPILLARY: 165 mg/dL — AB (ref 70–99)
Glucose-Capillary: 161 mg/dL — ABNORMAL HIGH (ref 70–99)

## 2018-03-09 LAB — SODIUM: Sodium: 129 mmol/L — ABNORMAL LOW (ref 135–145)

## 2018-03-09 LAB — CULTURE, BLOOD (ROUTINE X 2)
CULTURE: NO GROWTH
Special Requests: ADEQUATE

## 2018-03-09 NOTE — Discharge Instructions (Signed)
Fue un Games developerplacer conocerte durante esta hospitalizacin. Viniste al hospital porque tenas debilidad y te caste. Creemos que su debilidad fue causada por su nivel de sodio bajo. Esto es probablemente porque usted no est comiendo Environmental health practitionermucho en casa. Para evitar que esto suceda en el futuro, asegrese de comer alimentos con sal en casa.  It was so nice to meet you during this hospitalization. You came into the hospital because you were having weakness and you fell. We think your weakness was caused by your sodium level being low. This is probably because you are not eating much at home. To prevent this from happening in the future, please make sure you eat foods with salt at home.  -Dr. Nancy MarusMayo

## 2018-03-09 NOTE — Progress Notes (Signed)
Pt has been discharged home.  Discharge papers given and explained to Pt and Norvel RichardsJose Luciano, son in Social workerlaw.  Verbalized understanding. F/u appointment reviewed.

## 2018-03-09 NOTE — Progress Notes (Signed)
[facility]  ROUNDING NOTE   Subjective:  Na 129  History taken with assistance of video interpreter.  Objective:  Vital signs in last 24 hours:  Temp:  [97.4 F (36.3 C)-98.4 F (36.9 C)] 98.3 F (36.8 C) (12/09 1157) Pulse Rate:  [89-96] 89 (12/09 1157) Resp:  [14-18] 18 (12/09 1157) BP: (126-142)/(76-94) 129/78 (12/09 1157) SpO2:  [97 %-100 %] 100 % (12/09 1157)  Weight change:  Filed Weights   03/04/18 1256 03/04/18 2147  Weight: 65 kg 66.1 kg    Intake/Output: I/O last 3 completed shifts: In: 1075.3 [I.V.:1075.3] Out: 1500 [Urine:1500]   Intake/Output this shift:  No intake/output data recorded.  Physical Exam: General: No acute distress  Head: Normocephalic, atraumatic. Moist oral mucosal membranes  Eyes: Anicteric  Neck: Supple, trachea midline  Lungs:  Clear to auscultation, normal effort  Heart: S1S2 no rubs  Abdomen:  Soft, nontender, bowel sounds present  Extremities: No peripheral edema.  Neurologic: Awake, alert, following commands  Skin: No lesions       Basic Metabolic Panel: Recent Labs  Lab 03/05/18 0452  03/05/18 0945 03/05/18 1141  03/05/18 2254  03/06/18 0507  03/06/18 1215  03/07/18 0610  03/07/18 1542 03/07/18 2230 03/08/18 0150 03/08/18 0541 03/09/18 0548  NA 127*   < > 126* 129*   < > 121*   < > 121*   < > 120*   < > 125*   < > 124* 125* 126* 129* 129*  K 2.8*  --   --  2.9*  --  3.6  --  3.6  --  3.3*  --  3.9  --   --   --   --  3.8  --   CL 93*  --   --  95*  --   --   --  92*  --   --   --  90*  --   --   --   --  94*  --   CO2 24  --   --  24  --   --   --  24  --   --   --  25  --   --   --   --  26  --   GLUCOSE 189*  --   --  66*  --   --   --  176*  --   --   --  144*  --   --   --   --  131*  --   BUN 16  --   --  14  --   --   --  9  --   --   --  8  --   --   --   --  15  --   CREATININE 0.79  --   --  0.78  --   --   --  0.53*  --   --   --  0.64  --   --   --   --  0.64  --   CALCIUM 8.6*  --   --   8.9  --   --   --  8.2*  --   --   --  9.0  --   --   --   --  8.9  --   MG  --   --  2.1  --   --   --   --   --   --   --   --   --   --   --   --   --   --   --  PHOS  --   --  2.7  --   --   --   --   --   --   --   --   --   --   --   --   --   --   --    < > = values in this interval not displayed.    Liver Function Tests: Recent Labs  Lab 03/04/18 1301  AST 89*  ALT 44  ALKPHOS 58  BILITOT 2.2*  PROT 7.5  ALBUMIN 4.6   Recent Labs  Lab 03/05/18 1809  LIPASE 26  AMYLASE 57   Recent Labs  Lab 03/04/18 1302  AMMONIA 34    CBC: Recent Labs  Lab 03/04/18 1301 03/05/18 0452  WBC 12.9* 4.9  NEUTROABS 10.9*  --   HGB 11.1* 9.6*  HCT 30.1* 26.8*  MCV 86.7 88.7  PLT 284 210    Cardiac Enzymes: Recent Labs  Lab 03/04/18 1301 03/04/18 2152 03/06/18 0507  CKTOTAL  --  2,429* 582*  TROPONINI <0.03  --   --     BNP: Invalid input(s): POCBNP  CBG: Recent Labs  Lab 03/08/18 1131 03/08/18 1641 03/08/18 2115 03/09/18 0754 03/09/18 1200  GLUCAP 160* 146* 181* 161* 165*    Microbiology: Results for orders placed or performed during the hospital encounter of 03/04/18  Blood culture (routine x 2)     Status: None   Collection Time: 03/04/18  1:02 PM  Result Value Ref Range Status   Specimen Description [facility]  Final   Special Requests   Final    BOTTLES DRAWN AEROBIC AND ANAEROBIC Blood Culture adequate volume   Culture   Final    NO GROWTH 5 DAYS Performed at [facility], [address] [place], [place] [address]    Report Status 03/09/2018 FINAL  Final  Blood culture (routine x 2)     Status: None   Collection Time: 03/04/18  1:02 PM  Result Value Ref Range Status   Specimen Description   Final    BLOOD LEFT ANTECUBITAL Performed at [facility], [address] [place], [place] [address]    Special Requests   Final    BOTTLES DRAWN AEROBIC AND ANAEROBIC Blood Culture results may not be optimal due to an excessive  volume of blood received in culture bottles   Culture  Setup Time   Final    ANAEROBIC BOTTLE ONLY GRAM POSITIVE RODS CRITICAL RESULT CALLED TO, READ BACK BY AND VERIFIED WITH: KAREN HAYES AT 0847 ON 03/09/18 [facility]. Performed at [facility], [address] [place], [place] [address]    Culture GRAM POSITIVE RODS  Final   Report Status 03/09/2018 FINAL  Final  MRSA PCR Screening     Status: None   Collection Time: 03/04/18  9:39 PM  Result Value Ref Range Status   MRSA by PCR NEGATIVE NEGATIVE Final    Comment:        The GeneXpert MRSA Assay (FDA approved for NASAL specimens only), is one component of a comprehensive MRSA colonization surveillance program. It is not intended to diagnose MRSA infection nor to guide or monitor treatment for MRSA infections. Performed at [facility], [address] [place], [place] [address]     Coagulation Studies: No results for input(s): LABPROT, INR in the last 72 hours.  Urinalysis: No results for input(s): COLORURINE, LABSPEC, PHURINE, GLUCOSEU, HGBUR, BILIRUBINUR, KETONESUR, PROTEINUR, UROBILINOGEN, NITRITE, LEUKOCYTESUR in  the last 72 hours.  Invalid input(s): APPERANCEUR    Imaging: No results found.   Medications:    . enoxaparin (LOVENOX) injection  40 mg Subcutaneous Q24H  . folic acid  1 mg Oral Daily  . insulin aspart  0-9 Units Subcutaneous TID WC  . multivitamin with minerals  1 tablet Oral Daily  . pneumococcal 23 valent vaccine  0.5 mL Intramuscular Tomorrow-1000  . sodium chloride flush  10-40 mL Intracatheter Q12H  . thiamine  100 mg Oral Daily   Or  . thiamine  100 mg Intravenous Daily  . trimethoprim-polymyxin b  2 drop Both Eyes QID   acetaminophen **OR** acetaminophen, HYDROcodone-acetaminophen, ondansetron **OR** ondansetron (ZOFRAN) IV, phenol, polyethylene glycol, sodium chloride flush  Assessment/ Plan:  62 y.o. male with a PMHx of alcohol abuse, who was admitted to [facility] on  03/04/2018 for evaluation of generalized weakness, fall, and suspected alcohol withdrawal.   1.  Hyponatremia. 2.  ETOH abuse. 3.  Hypokalemia. 4.  Anemia unspecified.   Plan:   - alcohol cessation - Eating high soluble foods - Will need outpatient follow up.    LOS: 5 Anwyn Kriegel 12/9/201912:13 PM

## 2018-03-09 NOTE — Discharge Summary (Signed)
[employer] - [place] at [facility]   PATIENT NAME: Jantzen Pilger    MR#:  [id number]  DATE OF BIRTH:  1955/04/22  DATE OF ADMISSION:  03/04/2018   ADMITTING PHYSICIAN: Adrian Saran, MD  DATE OF DISCHARGE: 03/09/18  PRIMARY CARE PHYSICIAN: Patient, No Pcp Per   ADMISSION DIAGNOSIS:  Alcoholic ketoacidosis [E87.2] Hyponatremia [E87.1] DISCHARGE DIAGNOSIS:  Active Problems:   Hyponatremia  SECONDARY DIAGNOSIS:  History reviewed. No pertinent past medical history. HOSPITAL COURSE:   Lindbergh is a 62 year old male with a known history of alcohol abuse who presented to the ED with generalized weakness, fall, and left hip pain.  He was found on the basement floor by his family.  In the ED, his sodium level was found to be 113.  X-ray of pelvis was negative for fracture.  He was admitted for further management.  Acute hyponatremia- likely due to beer potomania.  Initially received 3% sodium.  Sodium improved from 113>129.  Sodium remains stable off IV fluids for 24 hours.  Seen by nephrology this admission, and will follow up with them as an outpatient.  Rhabdomyolysis- Resolved.  Initial CK 2429.  Improved to 582 with IV fluids.  Alcohol withdrawal/alcohol abuse- initially on Precedex drip in the ICU.  Then transitioned to CIWA protocol with Ativan.  Alcohol cessation counseling provided this admission.  Mechanical fall and left hip pain-x-ray pelvis and CT left hip both negative for fracture and showed intact left hip arthroplasty.  Seen by physical therapy, who recommended home health physical therapy.  Patient unable to get home health due to lack of insurance.  Discharged home with cane.  Anemia of chronic disease- no evidence of active GI bleed.  This should be followed as an outpatient.  Diabetes mellitus type 2- treated with sliding scale insulin this admission.  Metformin restarted on discharge.  DISCHARGE CONDITIONS:  Acute  hyponatremia-improved Alcohol abuse Mechanical fall Left hip pain Anemia of chronic disease Type 2 diabetes CONSULTS OBTAINED:  Treatment Team:  Mady Haagensen, MD DRUG ALLERGIES:  No Known Allergies DISCHARGE MEDICATIONS:   Allergies as of 03/09/2018   No Known Allergies     Medication List    STOP taking these medications   metFORMIN 500 MG tablet Commonly known as:  GLUCOPHAGE   ondansetron 4 MG disintegrating tablet Commonly known as:  ZOFRAN-ODT        DISCHARGE INSTRUCTIONS:  1.  Follow-up with nephrology in 1 to 2 weeks 2.  Needs sodium rechecked as an outpatient 3.  Continued alcohol cessation counseling as an outpatient 4.  Follow-up anemia as an outpatient DIET:  Regular diet DISCHARGE CONDITION:  Stable ACTIVITY:  Activity as tolerated OXYGEN:  Home Oxygen: No.  Oxygen Delivery: room air DISCHARGE LOCATION:  home   If you experience worsening of your admission symptoms, develop shortness of breath, life threatening emergency, suicidal or homicidal thoughts you must seek medical attention immediately by calling 911 or calling your MD immediately  if symptoms less severe.  You Must read complete instructions/literature along with all the possible adverse reactions/side effects for all the Medicines you take and that have been prescribed to you. Take any new Medicines after you have completely understood and accpet all the possible adverse reactions/side effects.   Please note  You were cared for by a hospitalist during your hospital stay. If you have any questions about your discharge medications or the care you received while you were in the hospital after you are discharged, you  can call the unit and asked to speak with the hospitalist on call if the hospitalist that took care of you is not available. Once you are discharged, your primary care physician will handle any further medical issues. Please note that NO REFILLS for any discharge medications  will be authorized once you are discharged, as it is imperative that you return to your primary care physician (or establish a relationship with a primary care physician if you do not have one) for your aftercare needs so that they can reassess your need for medications and monitor your lab values.    On the day of Discharge:  VITAL SIGNS:  Blood pressure (!) 142/76, pulse 94, temperature (!) 97.4 F (36.3 C), temperature source Oral, resp. rate 14, height 5\' 4"  (1.626 m), weight 66.1 kg, SpO2 98 %. PHYSICAL EXAMINATION:  GENERAL:  62 y.o.-year-old patient lying in the bed with no acute distress.  EYES: Pupils equal, round, reactive to light and accommodation. No scleral icterus. Extraocular muscles intact.  HEENT: Head atraumatic, normocephalic. Oropharynx and nasopharynx clear.  NECK:  Supple, no jugular venous distention. No thyroid enlargement, no tenderness.  LUNGS: Normal breath sounds bilaterally, no wheezing, rales,rhonchi or crepitation. No use of accessory muscles of respiration.  CARDIOVASCULAR: RRR, S1, S2 normal. No murmurs, rubs, or gallops.  ABDOMEN: Soft, non-tender, non-distended. Bowel sounds present. No organomegaly or mass.  EXTREMITIES: No pedal edema, cyanosis, or clubbing. +mild left hip pain with left leg movement.  NEUROLOGIC: Cranial nerves II through XII are intact. +global weakness. Sensation intact. Gait not checked.  PSYCHIATRIC: The patient is alert and oriented x 3.  SKIN: No obvious rash, lesion, or ulcer.  DATA REVIEW:   CBC Recent Labs  Lab 03/05/18 0452  WBC 4.9  HGB 9.6*  HCT 26.8*  PLT 210    Chemistries  Recent Labs  Lab 03/04/18 1301  03/05/18 0945  03/08/18 0541 03/09/18 0548  NA 113*   < > 126*   < > 129* 129*  K 3.4*   < >  --    < > 3.8  --   CL 74*   < >  --    < > 94*  --   CO2 18*   < >  --    < > 26  --   GLUCOSE 133*   < >  --    < > 131*  --   BUN 14   < >  --    < > 15  --   CREATININE 1.11   < >  --    < > 0.64  --    CALCIUM 8.9   < >  --    < > 8.9  --   MG  --   --  2.1  --   --   --   AST 89*  --   --   --   --   --   ALT 44  --   --   --   --   --   ALKPHOS 58  --   --   --   --   --   BILITOT 2.2*  --   --   --   --   --    < > = values in this interval not displayed.     Microbiology Results  Results for orders placed or performed during the hospital encounter of 03/04/18  Blood culture (routine x 2)  Status: None   Collection Time: 03/04/18  1:02 PM  Result Value Ref Range Status   Specimen Description BLOOD RIGHT [facility]  Final   Special Requests   Final    BOTTLES DRAWN AEROBIC AND ANAEROBIC Blood Culture adequate volume   Culture   Final    NO GROWTH 5 DAYS Performed at [facility], [address] [place], [place] [address]    Report Status 03/09/2018 FINAL  Final  Blood culture (routine x 2)     Status: None   Collection Time: 03/04/18  1:02 PM  Result Value Ref Range Status   Specimen Description BLOOD LEFT AC  Final   Special Requests   Final    BOTTLES DRAWN AEROBIC AND ANAEROBIC Blood Culture results may not be optimal due to an excessive volume of blood received in culture bottles   Culture  Setup Time   Final    ANAEROBIC BOTTLE ONLY GRAM POSITIVE RODS CRITICAL RESULT CALLED TO, READ BACK BY AND VERIFIED WITH: KAREN HAYES AT 0847 ON 03/09/18 [facility]. Performed at [facility], [address] [place], [place] [address]    Culture GRAM POSITIVE RODS  Final   Report Status 03/09/2018 FINAL  Final  MRSA PCR Screening     Status: None   Collection Time: 03/04/18  9:39 PM  Result Value Ref Range Status   MRSA by PCR NEGATIVE NEGATIVE Final    Comment:        The GeneXpert MRSA Assay (FDA approved for NASAL specimens only), is one component of a comprehensive MRSA colonization surveillance program. It is not intended to diagnose MRSA infection nor to guide or monitor treatment for MRSA infections. Performed at [facility], [address] [place], [place] [address]     RADIOLOGY:  No results found.   Management plans discussed with the patient, family and they are in agreement.  CODE STATUS: Full Code   TOTAL TIME TAKING CARE OF THIS PATIENT: 35 minutes.    Jinny BlossomKaty D Mayo M.D on 03/09/2018 at 9:01 AM  Between 7am to 6pm - Pager - [phone]  After 6pm go to [email or web address] - Social research officer, governmentpassword [email or web address]  [facility] [place] [facility]  Office  [phone]  CC: Primary care physician; Patient, No Pcp Per   Note: This dictation was prepared with Dragon dictation along with smaller phrase technology. Any transcriptional errors that result from this process are unintentional.

## 2018-03-09 NOTE — Care Management (Addendum)
Dr. Nancy MarusMayo requested cane for Geoffrey Solis. Geoffrey GottronJason Solis, [facility] representative updated.  Will be following up with Geoffrey Solis. States that his family will help with medications Geoffrey GreetBrenda S Denay Pleitez RN MSN CCM Care Management [phone]

## 2018-03-11 LAB — CULTURE, BLOOD (ROUTINE X 2)

## 2020-08-12 ENCOUNTER — Emergency Department: Payer: Self-pay

## 2020-08-12 ENCOUNTER — Inpatient Hospital Stay
Admission: EM | Admit: 2020-08-12 | Discharge: 2020-08-13 | DRG: 683 | Discharge status: Against Medical Advice | Payer: Self-pay | Attending: Internal Medicine | Admitting: Internal Medicine

## 2020-08-12 DIAGNOSIS — R61 Generalized hyperhidrosis: Secondary | ICD-10-CM | POA: Diagnosis present

## 2020-08-12 DIAGNOSIS — G9340 Encephalopathy, unspecified: Secondary | ICD-10-CM | POA: Insufficient documentation

## 2020-08-12 DIAGNOSIS — E871 Hypo-osmolality and hyponatremia: Secondary | ICD-10-CM | POA: Diagnosis present

## 2020-08-12 DIAGNOSIS — F10939 Alcohol use, unspecified with withdrawal, unspecified: Secondary | ICD-10-CM | POA: Diagnosis present

## 2020-08-12 DIAGNOSIS — E872 Acidosis: Secondary | ICD-10-CM | POA: Diagnosis present

## 2020-08-12 DIAGNOSIS — F1010 Alcohol abuse, uncomplicated: Secondary | ICD-10-CM | POA: Diagnosis present

## 2020-08-12 DIAGNOSIS — R569 Unspecified convulsions: Secondary | ICD-10-CM

## 2020-08-12 DIAGNOSIS — F10239 Alcohol dependence with withdrawal, unspecified: Secondary | ICD-10-CM | POA: Diagnosis present

## 2020-08-12 DIAGNOSIS — N179 Acute kidney failure, unspecified: Principal | ICD-10-CM | POA: Diagnosis present

## 2020-08-12 DIAGNOSIS — H9193 Unspecified hearing loss, bilateral: Secondary | ICD-10-CM | POA: Diagnosis present

## 2020-08-12 DIAGNOSIS — Z20822 Contact with and (suspected) exposure to covid-19: Secondary | ICD-10-CM | POA: Diagnosis present

## 2020-08-12 DIAGNOSIS — E8729 Other acidosis: Secondary | ICD-10-CM | POA: Diagnosis present

## 2020-08-12 DIAGNOSIS — D649 Anemia, unspecified: Secondary | ICD-10-CM | POA: Diagnosis present

## 2020-08-12 DIAGNOSIS — Z5321 Procedure and treatment not carried out due to patient leaving prior to being seen by health care provider: Secondary | ICD-10-CM | POA: Diagnosis present

## 2020-08-12 HISTORY — DX: Tremor, unspecified: R25.1

## 2020-08-12 HISTORY — DX: Unspecified hearing loss, unspecified ear: H91.90

## 2020-08-12 HISTORY — DX: Alcohol abuse, uncomplicated: F10.10

## 2020-08-12 LAB — COMPREHENSIVE METABOLIC PANEL
ALT: 19 U/L (ref 0–44)
AST: 47 U/L — ABNORMAL HIGH (ref 15–41)
Albumin: 4.9 g/dL (ref 3.5–5.0)
Alkaline Phosphatase: 57 U/L (ref 38–126)
Anion gap: 20 — ABNORMAL HIGH (ref 5–15)
BUN: 18 mg/dL (ref 8–23)
CO2: 20 mmol/L — ABNORMAL LOW (ref 22–32)
Calcium: 9.6 mg/dL (ref 8.9–10.3)
Chloride: 87 mmol/L — ABNORMAL LOW (ref 98–111)
Creatinine, Ser: 1.46 mg/dL — ABNORMAL HIGH (ref 0.61–1.24)
GFR, Estimated: 52 mL/min — ABNORMAL LOW (ref >60)
Glucose, Bld: 121 mg/dL — ABNORMAL HIGH (ref 70–99)
Potassium: 3.8 mmol/L (ref 3.5–5.1)
Sodium: 127 mmol/L — ABNORMAL LOW (ref 135–145)
Total Bilirubin: 2 mg/dL — ABNORMAL HIGH (ref 0.3–1.2)
Total Protein: 8.7 g/dL — ABNORMAL HIGH (ref 6.5–8.1)

## 2020-08-12 LAB — FERRITIN: Ferritin: 59 ng/mL (ref 24–336)

## 2020-08-12 LAB — CBC WITH DIFFERENTIAL/PLATELET
Abs Immature Granulocytes: 0.02 10*3/uL (ref 0.00–0.07)
Basophils Absolute: 0.1 10*3/uL (ref 0.0–0.1)
Basophils Relative: 1 %
Eosinophils Absolute: 0.1 10*3/uL (ref 0.0–0.5)
Eosinophils Relative: 1 %
HCT: 32.3 % — ABNORMAL LOW (ref 39.0–52.0)
Hemoglobin: 11.1 g/dL — ABNORMAL LOW (ref 13.0–17.0)
Immature Granulocytes: 0 %
Lymphocytes Relative: 37 %
Lymphs Abs: 2.4 10*3/uL (ref 0.7–4.0)
MCH: 30.4 pg (ref 26.0–34.0)
MCHC: 34.4 g/dL (ref 30.0–36.0)
MCV: 88.5 fL (ref 80.0–100.0)
Monocytes Absolute: 0.4 10*3/uL (ref 0.1–1.0)
Monocytes Relative: 7 %
Neutro Abs: 3.4 10*3/uL (ref 1.7–7.7)
Neutrophils Relative %: 54 %
Platelets: 241 10*3/uL (ref 150–400)
RBC: 3.65 MIL/uL — ABNORMAL LOW (ref 4.22–5.81)
RDW: 12.8 % (ref 11.5–15.5)
WBC: 6.4 10*3/uL (ref 4.0–10.5)
nRBC: 0 % (ref 0.0–0.2)

## 2020-08-12 LAB — RETICULOCYTES
Immature Retic Fract: 8.6 % (ref 2.3–15.9)
RBC.: 3.63 MIL/uL — ABNORMAL LOW (ref 4.22–5.81)
Retic Count, Absolute: 38.1 10*3/uL (ref 19.0–186.0)
Retic Ct Pct: 1.1 % (ref 0.4–3.1)

## 2020-08-12 LAB — IRON AND TIBC
Iron: 76 ug/dL (ref 45–182)
Saturation Ratios: 19 % (ref 17.9–39.5)
TIBC: 405 ug/dL (ref 250–450)
UIBC: 329 ug/dL

## 2020-08-12 LAB — BASIC METABOLIC PANEL
Anion gap: 13 (ref 5–15)
BUN: 21 mg/dL (ref 8–23)
CO2: 23 mmol/L (ref 22–32)
Calcium: 9 mg/dL (ref 8.9–10.3)
Chloride: 89 mmol/L — ABNORMAL LOW (ref 98–111)
Creatinine, Ser: 1.17 mg/dL (ref 0.61–1.24)
GFR, Estimated: >60 mL/min (ref >60)
Glucose, Bld: 190 mg/dL — ABNORMAL HIGH (ref 70–99)
Potassium: 3.4 mmol/L — ABNORMAL LOW (ref 3.5–5.1)
Sodium: 125 mmol/L — ABNORMAL LOW (ref 135–145)

## 2020-08-12 LAB — CK: Total CK: 531 U/L — ABNORMAL HIGH (ref 49–397)

## 2020-08-12 LAB — TSH: TSH: 5.617 u[IU]/mL — ABNORMAL HIGH (ref 0.350–4.500)

## 2020-08-12 LAB — PROTIME-INR
INR: 1.1 (ref 0.8–1.2)
Prothrombin Time: 14 seconds (ref 11.4–15.2)

## 2020-08-12 LAB — FOLATE: Folate: 25 ng/mL (ref >5.9)

## 2020-08-12 LAB — TROPONIN I (HIGH SENSITIVITY): Troponin I (High Sensitivity): 8 ng/L (ref <18)

## 2020-08-12 LAB — MAGNESIUM: Magnesium: 1.6 mg/dL — ABNORMAL LOW (ref 1.7–2.4)

## 2020-08-12 LAB — SALICYLATE LEVEL: Salicylate Lvl: <7 mg/dL — ABNORMAL LOW (ref 7.0–30.0)

## 2020-08-12 LAB — ETHANOL: Alcohol, Ethyl (B): <10 mg/dL (ref <10)

## 2020-08-12 LAB — OSMOLALITY: Osmolality: 275 mOsm/kg (ref 275–295)

## 2020-08-12 MED ORDER — THIAMINE HCL 100 MG/ML IJ SOLN
500.0000 mg | Freq: Once | INTRAMUSCULAR | Status: AC
  Administered 2020-08-12: 500 mg via INTRAMUSCULAR
  Filled 2020-08-12: qty 6

## 2020-08-12 MED ORDER — LACTATED RINGERS IV BOLUS
1000.0000 mL | Freq: Once | INTRAVENOUS | Status: AC
  Administered 2020-08-12: 1000 mL via INTRAVENOUS

## 2020-08-12 MED ORDER — LORAZEPAM 2 MG/ML IJ SOLN
0.0000 mg | Freq: Four times a day (QID) | INTRAMUSCULAR | Status: DC
  Administered 2020-08-12 – 2020-08-13 (×2): 2 mg via INTRAVENOUS
  Filled 2020-08-12 (×2): qty 1

## 2020-08-12 MED ORDER — ACETAMINOPHEN 325 MG PO TABS: 650.0000 mg | ORAL_TABLET | Freq: Four times a day (QID) | ORAL | Status: DC | PRN

## 2020-08-12 MED ORDER — ADULT MULTIVITAMIN W/MINERALS CH: 1.0000 | ORAL_TABLET | Freq: Every day | ORAL | Status: DC

## 2020-08-12 MED ORDER — THIAMINE HCL 100 MG PO TABS: 100.0000 mg | ORAL_TABLET | Freq: Every day | ORAL | Status: DC

## 2020-08-12 MED ORDER — LORAZEPAM 2 MG PO TABS: 0.0000 mg | ORAL_TABLET | Freq: Two times a day (BID) | ORAL | Status: DC

## 2020-08-12 MED ORDER — ACETAMINOPHEN 650 MG RE SUPP: 650.0000 mg | Freq: Four times a day (QID) | RECTAL | Status: DC | PRN

## 2020-08-12 MED ORDER — LORAZEPAM 2 MG PO TABS: 0.0000 mg | ORAL_TABLET | Freq: Four times a day (QID) | ORAL | Status: DC

## 2020-08-12 MED ORDER — THIAMINE HCL 100 MG/ML IJ SOLN: 100.0000 mg | Freq: Every day | INTRAMUSCULAR | Status: DC

## 2020-08-12 MED ORDER — LORAZEPAM 2 MG/ML IJ SOLN: 0.0000 mg | Freq: Two times a day (BID) | INTRAMUSCULAR | Status: DC

## 2020-08-12 MED ORDER — THIAMINE HCL 100 MG/ML IJ SOLN
Freq: Once | INTRAVENOUS | Status: AC
  Filled 2020-08-12: qty 1000

## 2020-08-12 MED ORDER — FOLIC ACID 1 MG PO TABS: 1.0000 mg | ORAL_TABLET | Freq: Every day | ORAL | Status: DC

## 2020-08-12 MED ORDER — ENOXAPARIN SODIUM 40 MG/0.4ML IJ SOSY: 40.0000 mg | PREFILLED_SYRINGE | INTRAMUSCULAR | Status: DC

## 2020-08-12 NOTE — ED Triage Notes (Signed)
Patient BIBA from [facility]. EMS called to store for unresponsive man. Per EMS patient was diaphoretic and sitting on ground when they arrived, patient had 1 episode of seizure like activity lasting 2 min. Patient was brought to ER. EMS states limited hx d/t spanish speaking only. Patient A&OX3 on arrival to ER. Translator at bedside.

## 2020-08-12 NOTE — ED Notes (Signed)
Admitting provider Dr. Arlean Hopping at bedside with patient and patients daughter at this time.

## 2020-08-12 NOTE — H&P (Signed)
History and Physical    PLEASE NOTE THAT DRAGON DICTATION SOFTWARE WAS USED IN THE CONSTRUCTION OF THIS NOTE.   Geoffrey Solis TWS:[id number] DOB: November 20, 1955 DOA: 08/12/2020  PCP: Patient, No Pcp Per (Inactive) Patient coming from: home   I have personally briefly reviewed patient's old medical records in [place]  Chief Complaint: Altered mental status  HPI: Zachari Alberta is a 65 y.o. male with medical history significant for chronic alcohol abuse, chronic hearing loss, chronic anemia with baseline hemoglobin 10-11, who is admitted to [facility] on 08/12/2020 with acute kidney injury after presenting from home via EMS to [facility] ED for evaluation of altered mental status.   The following history is provided by the patient, the patient's daughter, who is present at bedside, discussions with the emergency department physician and via chart review.  Per EMS report, emergency services were [place] the local [employer] [facility] for evaluation of this patient, due to concern from a good Samaritan that the patient was exhibiting evidence of diminished responsiveness.  Initial details leading to the concern of the good Samaritan prompting their contact of EMS are currently unclear to me.  Upon [place] the [facility] store, EMS observed the patient to be lying on the sidewalk next to the store.  They reportedly noted some generalized tremulous activity that lasted for less than 1 minute before completely resolving upon the patient awakening.  Initially, EMS was unable to arouse the patient to verbal stimuli, but reportedly awakened to tactile stimulation, with EMS noting the patient to be alert at that time.  Unable to assess the patient's current mental status further, including level of orientation in the setting of the patient being predominantly Spanish-speaking, the patient was brought via EMS to [facility] ED for further evaluation.   Per my discussions with the patient  and his daughter, who is present at bedside, the patient conveys that after performing some manual labor in the sun earlier today, that he walked several miles in route to his home.  While still in route, the patient reported that he stopped at the dollar store in order to take a break as well as a nap.  He conveys that he was able to fall asleep on the sidewalk off to the side of the dollar store.  Daughter conveys that the patient is a very deep sleeper, and is very difficult to rouse particularly given his history of bilateral hearing loss for which he is not currently have hearing aids.  Daughter reports that it is extremely difficult to wake the patient with verbal stimuli alone given this history of uncompensated hearing loss.  Initially, the patient and his daughter convey that the patient exhibits evidence of baseline tremors in the setting of his history of chronic alcohol abuse.  Daughter conveys that patient typically consumes 18-20 twelve oz beers per day, and has been consuming beer at that daily frequency for several years now.  Daughter also conveys that the patient does not like to speak about his history of chronic alcohol abuse, and the patient is nonspecific in narrowing the timeframe for his most recent consumption of alcohol, stating that he most recently consumed alcohol sometime over the last few days.  He does however convey that in spite of performing manual labor in the sun earlier today as well as engaging in a several mile walk, that he is consumed very little water over the course of the last day. patient and his daughter deny that the  patient has any history of seizures.  No history of recreational drug use.  Daughter confirms that the patient's mental status at the time of my encounter with him, including his current level of orientation, represents his baseline.  Denies any recent nausea, vomiting, abdominal pain, diarrhea, melena, or hematochezia.  He also denies any recent  subjective fever, chills, rigors, or generalized myalgias.  No recent headache, neck stiffness, rhinitis, rhinorrhea, sore throat, shortness of breath, cough, or rash.  No known recent COVID-19 exposures.  The patient denies any recent dysuria, gross hematuria, or change in urinary urgency/frequency.  Denies any recent chest pain, diaphoresis, palpitations, dizziness, presyncope, or syncope.  Denies any recent acute focal weakness, acute focal paresthesias, numbness, acute change in vision, slurred speech, facial droop, dysphagia, or vertigo.  Denies any history of stroke.  Prior discussions with the patient as well as his daughter, it is conveyed that aside from chronic alcohol abuse and chronic bilateral hearing loss, that the patient otherwise has no known underlying medical history, including no history of hypertension, hyperlipidemia, diabetes, coronary artery disease, congestive heart failure, or COPD.  They confirm that the patient is not on any prescription or over-the-counter medications as an outpatient, nor any herbals or supplements, even on a as needed basis.  The patient specifically denies any recent NSAID use.   Per chart review, it appears that the patient's most recent prior serum creatinine was noted to be 0.75 when checked on 05/06/2020, with baseline creatinine range of 0.6-0.8 dating back to 2019.  Serum sodium performed on 05/06/2020 was noted to be 135.      ED Course:  Vital signs in the ED were notable for the following: Tetramex 98.0, heart rate 93-1 03; blood pressure 151/86 -162/103; respiratory rate 20-22; oxygen saturation 98 to 100% on room air  Labs were notable for the following: CMP was notable for the following: Sodium 127 relative to most recent prior value of 135 on 05/06/2020 also relative to 129 on 03/09/2018, chloride 87, bicarbonate 20, anion gap 20, BUN 18, creatinine 1.46, calcium 9.6, albumin 4.9, alkaline phosphatase 57, AST 47, AL 19, total bilirubin 2.0.  Serum  magnesium level found to be 1.6.  High-sensitivity troponin I noted to be 8.  CBC showed white blood cell count of 6400, hemoglobin 11.1 with normocytic/normochromic findings and a nonelevated RDW, which is relative to most recent prior hemoglobin of 10.6 on 05/06/2020, and platelets 241.  Screening nasopharyngeal COVID-19/influenza PCR performed in the emergency department today, with result currently pending.  EKG was performed in the ED, with interpretation of such limited by the presence of motion artifact, but within the limitations appears to show sinus rhythm with heart rate 86, normal intervals, no evidence of T wave or ST changes.  Chest x-ray showed no evidence of acute cardiopulmonary process, including no evidence of infiltrate, edema, effusion, or pneumothorax.  Noncontrast CT of the head, by way of comparison to most recent prior CT head performed in December 2019, showed stable mild atrophy with patchy periventricular small vessel disease, unchanged from prior, and showed no evidence of acute intracranial process, including no evidence of intracranial hemorrhage or acute infarct.  While in the ED, the following were administered: Thiamine 500 mg IM x1 and a 2 L LR bolus.     Review of Systems: As per HPI otherwise 10 point review of systems negative.   Past Medical History:  Diagnosis Date  . Chronic alcohol abuse    per family, the patient typically  consumes 18 twelve oz beers per day  . Hard of hearing   . Occasional tremors     Past Surgical History:  Procedure Laterality Date  . FRACTURE SURGERY      Social History: Last  reports that he has never smoked. He has never used smokeless tobacco. He reports current alcohol use. He reports that he does not use drugs.   No Known Allergies  History reviewed. No pertinent family history.   Home Medications: The patient confirms that he is not on any prescription or over-the-counter medications as an outpatient, nor any  herbals or supplements,   Objective    Physical Exam: Vitals:   08/12/20 1534 08/12/20 1600 08/12/20 1715 08/12/20 1740  BP: (!) 151/86 (!) 177/110 (!) 162/103 (!) 156/104  Pulse: 93 94 (!) 103 (!) 102  Resp: 20 (!) 21 (!) 22 20  Temp: 98 F (36.7 C)     TempSrc: Oral     SpO2: 100% 100% 98% 100%  Weight: 66 kg     Height: 5' 4"  (1.626 m)       General: appears to be stated age; alert, oriented Skin: warm, dry, no rash Head:  AT/[place] Mouth:  Oral mucosa membranes appear dry, normal dentition Neck: supple; trachea midline Heart:  RRR; did not appreciate any M/R/G Lungs: CTAB, did not appreciate any wheezes, rales, or rhonchi Abdomen: + BS; soft, ND, NT Vascular: 2+ pedal pulses b/l; 2+ radial pulses b/l Extremities: no peripheral edema, no muscle wasting Neuro: strength and sensation intact in upper and lower extremities b/l    Labs on Admission: I have personally reviewed following labs and imaging studies  CBC: Recent Labs  Lab 08/12/20 1533  WBC 6.4  NEUTROABS 3.4  HGB 11.1*  HCT 32.3*  MCV 88.5  PLT 063   Basic Metabolic Panel: Recent Labs  Lab 08/12/20 1533  NA 127*  K 3.8  CL 87*  CO2 20*  GLUCOSE 121*  BUN 18  CREATININE 1.46*  CALCIUM 9.6  MG 1.6*   GFR: Estimated Creatinine Clearance: 41.1 mL/min (A) (by C-G formula based on SCr of 1.46 mg/dL (H)). Liver Function Tests: Recent Labs  Lab 08/12/20 1533  AST 47*  ALT 19  ALKPHOS 57  BILITOT 2.0*  PROT 8.7*  ALBUMIN 4.9   No results for input(s): LIPASE, AMYLASE in the last 168 hours. No results for input(s): AMMONIA in the last 168 hours. Coagulation Profile: No results for input(s): INR, PROTIME in the last 168 hours. Cardiac Enzymes: No results for input(s): CKTOTAL, CKMB, CKMBINDEX, TROPONINI in the last 168 hours. BNP (last 3 results) No results for input(s): PROBNP in the last 8760 hours. HbA1C: No results for input(s): HGBA1C in the last 72 hours. CBG: No results for  input(s): GLUCAP in the last 168 hours. Lipid Profile: No results for input(s): CHOL, HDL, LDLCALC, TRIG, CHOLHDL, LDLDIRECT in the last 72 hours. Thyroid Function Tests: No results for input(s): TSH, T4TOTAL, FREET4, T3FREE, THYROIDAB in the last 72 hours. Anemia Panel: No results for input(s): VITAMINB12, FOLATE, FERRITIN, TIBC, IRON, RETICCTPCT in the last 72 hours. Urine analysis:    Component Value Date/Time   COLORURINE YELLOW (A) 03/04/2018 1327   APPEARANCEUR CLEAR (A) 03/04/2018 1327   APPEARANCEUR Clear 07/03/2014 1240   LABSPEC 1.004 (L) 03/04/2018 1327   LABSPEC 1.007 07/03/2014 1240   PHURINE 6.0 03/04/2018 1327   GLUCOSEU 50 (A) 03/04/2018 1327   GLUCOSEU >=500 07/03/2014 1240   HGBUR SMALL (A) 03/04/2018 1327  BILIRUBINUR NEGATIVE 03/04/2018 1327   BILIRUBINUR Negative 07/03/2014 1240   KETONESUR 5 (A) 03/04/2018 1327   PROTEINUR NEGATIVE 03/04/2018 1327   NITRITE NEGATIVE 03/04/2018 1327   LEUKOCYTESUR NEGATIVE 03/04/2018 1327   LEUKOCYTESUR Negative 07/03/2014 1240    Radiological Exams on Admission: CT Head Wo Contrast  Result Date: 08/12/2020 CLINICAL DATA:  Seizure EXAM: CT HEAD WITHOUT CONTRAST TECHNIQUE: Contiguous axial images were obtained from the base of the skull through the vertex without intravenous contrast. COMPARISON:  March 04, 2018 FINDINGS: Brain: There is mild diffuse atrophy. There is no intracranial mass, hemorrhage, extra-axial fluid collection, or midline shift. There is mild patchy decreased attenuation in the centra semiovale bilaterally, stable. No acute appearing infarct evident on this study. Vascular: No hyperdense vessel. There are foci of calcification in each carotid siphon region. Skull: Bony calvarium appears intact. Sinuses/Orbits: There is mucosal thickening in several ethmoid air cells. Other visualized paranasal sinuses are clear. Visualized orbits appear symmetric bilaterally. Other: Visualized mastoid air cells are clear.  IMPRESSION: Stable mild atrophy with patchy periventricular small vessel disease, stable. No acute infarct. No mass or hemorrhage. Foci of arterial vascular calcification noted. There is mucosal thickening in several ethmoid air cells. Electronically Signed   By: Lowella Grip III M.D.   On: 08/12/2020 16:58   DG Chest Port 1 View  Result Date: 08/12/2020 CLINICAL DATA:  Seizure EXAM: PORTABLE CHEST 1 VIEW COMPARISON:  March 04, 2018 FINDINGS: The lungs are clear. Heart size and pulmonary vascularity are within normal limits. No adenopathy. No pneumothorax. No bone lesions. IMPRESSION: No edema or consolidation.  Stable cardiac silhouette. Electronically Signed   By: Lowella Grip III M.D.   On: 08/12/2020 16:54     EKG: Independently reviewed, with result as described above.    Assessment/Plan   Geoffrey Solis is a 65 y.o. male with medical history significant for chronic alcohol abuse, chronic hearing loss, chronic anemia with baseline hemoglobin 10-11, who is admitted to [facility] on 08/12/2020 with acute kidney injury after presenting from home via EMS to [facility] ED for evaluation of altered mental status.    Principal Problem:   AKI (acute kidney injury) ([place]) Active Problems:   Acute hyponatremia   Hypomagnesemia   High anion gap metabolic acidosis   Chronic anemia   Chronic alcohol abuse     #) Acute Kidney Injury: Relative to baseline creatinine range of 0.6-0.8, most recent prior creatinine noted to be 0.75 on 05/06/2020, presenting serum creatinine found to be 1.46.  Suspect that this is prerenal in nature in the setting of relative dehydration given the patient's report of extensive time working and ambulating in the hot sun over the course of the last day without compensatory consumption of water, with potential additional intravascular depletion stemming from the diuretic influences of the patient's chronic alcohol abuse.  Does not appear to be  on any nephrotoxic agents as an outpatient, including the patient's denial of use of any NSAIDs, ACE inhibitors, or ARB's.  We will expand work-up further if presenting AKI, as further detailed below, while providing intravascular repletion efforts via IV fluids, as further detailed below.  Of note, the patient had a 2 L LR bolus administered in the ED. transition to continuous fluids via banana bag in setting of a concomitant history of chronic alcohol abuse, as further detailed below.  Additionally, given the patient's report of significant exertional activity in the hot sun over the course of the last day, will  also check CPK level.    Plan: Check urinalysis with microscopy, with attention for the presence of urinary casts.  Check random urine sodium as well as random urine creatinine. monitor strict I's & O's and daily weights. Attempt to avoid nephrotoxic agents. Refrain from NSAIDs.  Repeat BMP at 2300 this evening, more so for the purpose of close trending of interval serum sodium trend . repeat BMP in the morning.  IV fluids via banana bag, as detailed below.  Check CPK level. if renal function does not improve with the above measures, can consider obtaining a renal US to evaluate for parenchymal abnormality as well as to assess for evidence of post-renal obstructive process.       #) Elevated anion gap metabolic acidosis: Presenting CMP reflects these findings, without historical or currently hyperglycemic evidence to suggest underlying DKA.  Suspect contribution from alcoholic ketoacidosis as well as contribution from acute kidney injury.  No evidence to suggest underlying infectious process, including chest x-ray that demonstrated no evidence of pneumonia.  Additionally, SIRS criteria are not met at this time.  Overall, presentation does not appear to be consistent with lactic acidosis due to underlying sepsis, although we will follow closely for results of urinalysis as well as COVID-19 PCR  performed in the ED today. given the patient's report of significant exertional activity in the hot sun over the course of the last day, will also check CPK level.  Additionally, given the patient's history of chronic alcohol use, will check INR to evaluate synthetic hepatic function.  Plan: Evaluation management of presenting acute kidney injury, including continuation of IV fluids, as above.  Follow-up results of urinalysis, including for the presence of ketones given suspected underlying alcoholic ketoacidosis.  Check salicylate level and CPK level.  Check VBG to evaluate for acidemia versus acidosis.  Repeat CMP in the morning.  Repeat CBC in the morning as well.  Add on serum ethanol level add on serum ethanol level and check urinary drug screen.  Check INR.     #) Hypomagnesemia: Serum magnesium found to be 1.6.  Suspect contribution from suboptimal nutrition in the context of chronic alcohol use.  Plan: I have ordered 2 g of magnesium sulfate to be administered with the existing banana bag.  Monitor on telemetry.  Repeat serum magnesium level in the morning.       #) Acute hypo-osmolar hyponatremia:  Presenting serum sodium found to be 127, without need for glycemic correction, relative to most recent prior serum sodium value of 135 on 05/06/2020.  While this meets criteria for acute hyponatremia, presentation may also be consistent with acute on chronic hyponatremia, given additional chart review revealing serum sodium levels consistently in the high 120s to low 130s dating back to 2019.  Suspect an element of beer drinkers Potomania given the patient's history of chronic alcohol abuse.  There may also be an additional contribution from hypovolemia given the patient's report of extensive manual labor and ambulation of several miles in the sun/warm ambient temperatures over the last day.  We will also evaluate for any contribution from SIADH, via laboratory testing as conveyed below.  Given that  there appears to be at least contribution from hypovolemia, will proceed with IV fluids to attend to this process while further evaluating for any additional contributing factors, including SIADH, as further detailed below.  No overt pharmacologic contributions.  Of note, no evidence of acute focal neurologic deficits.  At this time, presentation appears less suggestive of seizures given  HPI as conveyed above, although we will closely monitor overnight for overt evidence of such.  Of note, the patient received 2 L LR bolus in the ED this evening.  Consequently, will repeat BMP at 2300 to closely monitor interval trend in serum sodium level, including 2 evaluate for rapid correction of his presenting low sodium level.     Plan: monitor strict I's and O's and daily weights.  check UA, random urine sodium, urine osmolality.  Check serum osmolality to confirm suspected hypoosmolar etiology.  Repeat BMP at 2300, and again in the morning. Check TSH.  IV fluids in the form of banana bag, with potential adjustment in the associated rate of these continuous IV fluids depending on interval trend of serum sodium as betrayed by repeat BMP at 2300.  Counseled the patient on the importance of reduction in alcohol consumption.       #) Chronic Alcohol Abuse: Per my discussions with the patient as well as his daughter, who is present at bedside, the patient reportedly engages in typical consumption of 18-20 twelve beers on a daily basis, and has been consuming alcohol at that daily volume for multiple years now.  Timing of most recent alcohol consumption is not completely clear to me, given the nonspecific timeframe conveyed by the patient in response to my questions to try to further narrow this timeframe.  He denies any known history of underlying liver disease, and denies any interest in alcohol consumption discontinuation at this time. Close monitoring for development of evidence of alcohol withdrawal, including  close attention to trend in vital signs, particularly given current unclear timeframe of most recent alcohol consumption.  We will add on serum ethanol level to try to further narrow this timeframe.  Given that the patient appears to be currently at his baseline mental status and is conveying no desire to discontinue consuming alcohol, could consider providing beer with meals in order to prevent unnecessary alcohol withdrawal.     Plan: counseled the patient on the importance of reduction in alcohol consumption. Consult to transition of care team placed. Close monitoring of ensuing BP and HR via routine VS. Symptoms-based CIWA protocol with prn Ativan ordered. Seizure precautions. Telemetry.  Supplementation presenting hypomagnesemia, as further detailed above, with repeat serum magnesium level ordered for the morning.  Check serum phosphorus level as well as ionized calcium level. Repeat CMP in the morning. Check INR. Add-on serum ethanol level. 1 liter Banana bag with 2 grams of magnesium sulfate to be administered at a rate of 125 cc/h x 1 bag.  Subsequently, I have ordered daily oral supplementation with thiamine, folic acid, multivitamin to start tomorrow morning (08/13/20).       #) Chronic anemia: Per chart review, it appears that the patient has a baseline hemoglobin range of 10-11 dating back to 2019.  Presenting hemoglobin found to be consistent with this range at 11.1 relative to most recent prior value of 10.6 on 05/06/2020.  Today's hemoglobin associated with normocytic and normochromic findings as well as a nonelevated RDW.  Given the patient's history of chronic alcohol abuse and corresponding increased risk for B12/folic acid deficiency, will add on MMA as well as folic acid level to initial lab specimens drawn earlier today.  However, given that his anemia appears to be normocytic, will evaluate for potential multifactorial contributions to the patient's chronic anemia, including evaluating  iron studies, as detailed below.  No evidence of active bleed at this time.  We will also attempt to further  assess the timing of the patient's most recent routine screening colonoscopy.   Plan: Add on the following to previously collected lab specimens: Iron studies, MMA, folic acid level, reticulocyte count.  Check INR.  Repeat CBC in the morning.     DVT prophylaxis: Lovenox 40 mg subcu daily Code Status: Full code Family Communication: The patient's case was discussed with his daughter, who is present at bedside Disposition Plan: Per Rounding Team Consults called: none  Admission status: Observation;     Of note, this patient was added by me to the following Admit List/Treatment Team: armcadmits.      PLEASE NOTE THAT DRAGON DICTATION SOFTWARE WAS USED IN THE CONSTRUCTION OF THIS NOTE.   [facility] Pager [phone] From [place] [facility]  Otherwise, please contact night-coverage  [email or web address] Password [facility]   08/12/2020, 7:55 PM

## 2020-08-12 NOTE — ED Notes (Signed)
Patients family notified patient is at hospital, per daughter patient drinks approx 20 beers a day. Per daughter patient is confused at baseline d/t amount of alcohol he consumes daily. MD notified.

## 2020-08-12 NOTE — ED Notes (Signed)
3 sst, 1 red, 1 lt green, 1 blue all sent to lab at this time.

## 2020-08-12 NOTE — ED Notes (Signed)
Patient provided with water and remote, daughter at bedside.

## 2020-08-12 NOTE — ED Notes (Signed)
Seizure pads placed on bed

## 2020-08-12 NOTE — ED Notes (Signed)
Patients daughter Helmut Muster leaving at this time. States we can call for any questions or updates. [phone]

## 2020-08-12 NOTE — ED Provider Notes (Signed)
[facility] Emergency Department Provider Note ____________________________________________   Event Date/Time   First MD Initiated Contact with Patient 08/12/20 1537     (approximate)  I have reviewed the triage vital signs and the nursing notes.  HISTORY  Chief Complaint Seizures   HPI Geoffrey Solis is a 65 y.o. malewho presents to the ED for evaluation of seizure versus syncope  Chart review indicates 2019 admission for alcoholic ketoacidosis.  No further relevant history noted.  Patient presents to the ED via EMS from a local [employer] store where EMS was called due to an unresponsive patient.  EMS reports finding the patient diaphoretic next to one of the stores coolers, seated, where he promptly syncopized in front of them and had generalized shaking that lasted less than a minute before self resolving.  They report their monitor demonstrating heart rates 160-200 during this.  He awakened shortly thereafter without significant postictal period.  After this he recurrently requested home and indicated that he is fine.  Here in the ED, with in person Spanish interpreter, he repeatedly indicates that he is fine and has no problems.  Denies medical history or recent illnesses.  Denies drinking ethanol or ingesting additional recreational drugs.  Denies smoking.  Reports he feels fine now.  Reports he does not know why he is here and indicates that nothing happened earlier today.   History reviewed. No pertinent past medical history.  Patient Active Problem List   Diagnosis Date Noted  . Hyponatremia 03/04/2018    Past Surgical History:  Procedure Laterality Date  . FRACTURE SURGERY      Prior to Admission medications   Not on File    Allergies Patient has no known allergies.  History reviewed. No pertinent family history.  Social History Social History   Tobacco Use  . Smoking status: Never Smoker  . Smokeless tobacco: Never Used   Substance Use Topics  . Alcohol use: Yes    Comment: per family, 18 beers (12 oz) per day  . Drug use: No    Review of Systems  Constitutional: No fever/chills Eyes: No visual changes. ENT: No sore throat. Cardiovascular: Denies chest pain. Respiratory: Denies shortness of breath. Gastrointestinal: No abdominal pain.  No nausea, no vomiting.  No diarrhea.  No constipation. Genitourinary: Negative for dysuria. Musculoskeletal: Negative for back pain. Skin: Negative for rash. Neurological: Negative for headaches, focal weakness or numbness.  ____________________________________________   PHYSICAL EXAM:  VITAL SIGNS: Vitals:   08/12/20 1715 08/12/20 1740  BP: (!) 162/103 (!) 156/104  Pulse: (!) 103 (!) 102  Resp: (!) 22 20  Temp:    SpO2: 98% 100%    Constitutional: Alert and mostly oriented (believes the year is 2020).  Translator often has to repeat his questions, but patient does answer them appropriately.  He follows commands in all 4 extremities.  Minimal generalized tremulousness noted to the extremities, improving with volitional movements.  Eyes: Conjunctivae are normal. PERRL. EOMI. Head: Atraumatic. Nose: No congestion/rhinnorhea. Mouth/Throat: Mucous membranes are dry.  Oropharynx non-erythematous. Neck: No stridor. No cervical spine tenderness to palpation. Cardiovascular: Normal rate, regular rhythm. Grossly normal heart sounds.  Good peripheral circulation. Respiratory: Normal respiratory effort.  No retractions. Lungs CTAB. Gastrointestinal: Soft , nondistended, nontender to palpation. No CVA tenderness. Musculoskeletal: No lower extremity tenderness nor edema.  No joint effusions. No signs of acute trauma. Neurologic:  Normal speech and language. No gross focal neurologic deficits are appreciated.  Cranial nerves II through XII  intact 5/5 strength and sensation in all 4 extremities Skin:  Skin is warm, dry and intact. No rash noted. Psychiatric: Mood and  affect are normal. Speech and behavior are normal. ____________________________________________   LABS (all labs ordered are listed, but only abnormal results are displayed)  Labs Reviewed  CBC WITH DIFFERENTIAL/PLATELET - Abnormal; Notable for the following components:      Result Value   RBC 3.65 (*)    Hemoglobin 11.1 (*)    HCT 32.3 (*)    All other components within normal limits  COMPREHENSIVE METABOLIC PANEL - Abnormal; Notable for the following components:   Sodium 127 (*)    Chloride 87 (*)    CO2 20 (*)    Glucose, Bld 121 (*)    Creatinine, Ser 1.46 (*)    Total Protein 8.7 (*)    AST 47 (*)    Total Bilirubin 2.0 (*)    GFR, Estimated 52 (*)    Anion gap 20 (*)    All other components within normal limits  MAGNESIUM - Abnormal; Notable for the following components:   Magnesium 1.6 (*)    All other components within normal limits  SARS CORONAVIRUS 2 (TAT 6-24 HRS)  CBG MONITORING, ED  TROPONIN I (HIGH SENSITIVITY)   ____________________________________________  12 Lead EKG  Sinus rhythm, rate of 96 bpm.  Tremulousness affecting baseline.  Normal axis and intervals.  No evidence of acute ischemia. ____________________________________________  RADIOLOGY  ED MD interpretation: CT head reviewed by me without evidence of acute intracranial pathology. CXR reviewed by me without evidence of acute cardiopulmonary pathology  Official radiology report(s): CT Head Wo Contrast  Result Date: 08/12/2020 CLINICAL DATA:  Seizure EXAM: CT HEAD WITHOUT CONTRAST TECHNIQUE: Contiguous axial images were obtained from the base of the skull through the vertex without intravenous contrast. COMPARISON:  March 04, 2018 FINDINGS: Brain: There is mild diffuse atrophy. There is no intracranial mass, hemorrhage, extra-axial fluid collection, or midline shift. There is mild patchy decreased attenuation in the centra semiovale bilaterally, stable. No acute appearing infarct evident on  this study. Vascular: No hyperdense vessel. There are foci of calcification in each carotid siphon region. Skull: Bony calvarium appears intact. Sinuses/Orbits: There is mucosal thickening in several ethmoid air cells. Other visualized paranasal sinuses are clear. Visualized orbits appear symmetric bilaterally. Other: Visualized mastoid air cells are clear. IMPRESSION: Stable mild atrophy with patchy periventricular small vessel disease, stable. No acute infarct. No mass or hemorrhage. Foci of arterial vascular calcification noted. There is mucosal thickening in several ethmoid air cells. Electronically Signed   By: Bretta Bang III M.D.   On: 08/12/2020 16:58   DG Chest Port 1 View  Result Date: 08/12/2020 CLINICAL DATA:  Seizure EXAM: PORTABLE CHEST 1 VIEW COMPARISON:  March 04, 2018 FINDINGS: The lungs are clear. Heart size and pulmonary vascularity are within normal limits. No adenopathy. No pneumothorax. No bone lesions. IMPRESSION: No edema or consolidation.  Stable cardiac silhouette. Electronically Signed   By: Bretta Bang III M.D.   On: 08/12/2020 16:54    ____________________________________________   PROCEDURES and INTERVENTIONS  Procedure(s) performed (including Critical Care):  .1-3 Lead EKG Interpretation Performed by: Delton Prairie, MD Authorized by: Delton Prairie, MD     Interpretation: normal     ECG rate:  96   ECG rate assessment: normal     Rhythm: sinus rhythm     Ectopy: none     Conduction: normal      Medications  thiamine (B-1) injection 500 mg (has no administration in time range)  LORazepam (ATIVAN) injection 0-4 mg (has no administration in time range)    Or  LORazepam (ATIVAN) tablet 0-4 mg (has no administration in time range)  LORazepam (ATIVAN) injection 0-4 mg (has no administration in time range)    Or  LORazepam (ATIVAN) tablet 0-4 mg (has no administration in time range)  thiamine tablet 100 mg (has no administration in time range)     Or  thiamine (B-1) injection 100 mg (has no administration in time range)  lactated ringers bolus 1,000 mL (0 mLs Intravenous Stopped 08/12/20 1654)  lactated ringers bolus 1,000 mL (0 mLs Intravenous Stopped 08/12/20 1746)    ____________________________________________   MDM / ED COURSE   65 year old male with alcoholism presents to the ED with seizure versus syncope, with significant confusion and concern for acute on chronic encephalopathy, requiring observation medical admission.  Normal vitals on presentation, but developing tachycardia, concerning for initiation of alcohol withdrawals.  Exam demonstrates no neurologic or vascular deficits, laterality or focal deficits, though he is quite confused and disoriented.  Blood work with evidence of mild alcoholic ketoacidosis, for which he received about 1.5 L of LR prior to ripping out his IVs recurrently.  CT head demonstrates no evidence of ICH, CVA or mass to contribute to his worsening confusion.  May be a component of DTs in the setting of his possible withdrawals.  We will institute CIWA protocol admit to hospitalist medicine for further work-up and management.   Clinical Course as of 08/12/20 1839  Sat Aug 12, 2020  1604 Reassessed.  Clinically the same. [DS]  1744 Pt has pulled out multiple Iv's  [DS]  1829 Went to reassess the patient with in person translator, he continues to be quite confused and disoriented.  Reports that he is fine recurrently.  He is very inconsistent and I am concerned about confusion and continued disorientation.  We discussed medical admission and he expresses agreement. [DS]  [phone] Nurse speaks with patient's family while I am in the room, and they indicate that he drinks about a case of beer per day.  He is often confused.  They did not know that he was at the store today. [DS]  1837 Discussed case with Dr. Marilynn Rail, who agrees to admit. [DS]    Clinical Course User Index [DS] Delton Prairie, MD     ____________________________________________   FINAL CLINICAL IMPRESSION(S) / ED DIAGNOSES  Final diagnoses:  Seizure-like activity (HCC)  Alcoholic ketoacidosis     ED Discharge Orders    None       Deone Leifheit Katrinka Blazing   Note:  This document was prepared using Dragon voice recognition software and may include unintentional dictation errors.   Delton Prairie, MD 08/12/20 1840

## 2020-08-12 NOTE — ED Notes (Signed)
Patient transported to CT 

## 2020-08-12 NOTE — ED Notes (Addendum)
Pt ambulated into hallway, bed alarm ringing. Pt had pulled out IV and fluids were noted to be running in the floor. Pt redirected back to bed, offered urinal. Gauze dressing applied to L hand. BP and pulse ox monitor replaced. Bed rails up with padding in place. Primary RN Zella Ball notified.

## 2020-08-12 NOTE — ED Notes (Signed)
MD at bedside with interpreter

## 2020-08-12 NOTE — ED Notes (Signed)
Patient provided with meal per new diet order.

## 2020-08-13 ENCOUNTER — Other Ambulatory Visit: Payer: Self-pay

## 2020-08-13 DIAGNOSIS — N179 Acute kidney failure, unspecified: Principal | ICD-10-CM

## 2020-08-13 DIAGNOSIS — F10239 Alcohol dependence with withdrawal, unspecified: Secondary | ICD-10-CM | POA: Diagnosis present

## 2020-08-13 DIAGNOSIS — E871 Hypo-osmolality and hyponatremia: Secondary | ICD-10-CM

## 2020-08-13 DIAGNOSIS — D649 Anemia, unspecified: Secondary | ICD-10-CM

## 2020-08-13 DIAGNOSIS — F10939 Alcohol use, unspecified with withdrawal, unspecified: Secondary | ICD-10-CM | POA: Diagnosis present

## 2020-08-13 LAB — CBC
HCT: 28.6 % — ABNORMAL LOW (ref 39.0–52.0)
Hemoglobin: 10 g/dL — ABNORMAL LOW (ref 13.0–17.0)
MCH: 30.4 pg (ref 26.0–34.0)
MCHC: 35 g/dL (ref 30.0–36.0)
MCV: 86.9 fL (ref 80.0–100.0)
Platelets: 191 10*3/uL (ref 150–400)
RBC: 3.29 MIL/uL — ABNORMAL LOW (ref 4.22–5.81)
RDW: 12.6 % (ref 11.5–15.5)
WBC: 5.6 10*3/uL (ref 4.0–10.5)
nRBC: 0 % (ref 0.0–0.2)

## 2020-08-13 LAB — SARS CORONAVIRUS 2 (TAT 6-24 HRS): SARS Coronavirus 2: NEGATIVE

## 2020-08-13 LAB — HIV ANTIBODY (ROUTINE TESTING W REFLEX): HIV Screen 4th Generation wRfx: NONREACTIVE

## 2020-08-13 LAB — COMPREHENSIVE METABOLIC PANEL
ALT: 18 U/L (ref 0–44)
AST: 41 U/L (ref 15–41)
Albumin: 4.5 g/dL (ref 3.5–5.0)
Alkaline Phosphatase: 51 U/L (ref 38–126)
Anion gap: 14 (ref 5–15)
BUN: 20 mg/dL (ref 8–23)
CO2: 23 mmol/L (ref 22–32)
Calcium: 9.1 mg/dL (ref 8.9–10.3)
Chloride: 94 mmol/L — ABNORMAL LOW (ref 98–111)
Creatinine, Ser: 0.99 mg/dL (ref 0.61–1.24)
GFR, Estimated: >60 mL/min (ref >60)
Glucose, Bld: 136 mg/dL — ABNORMAL HIGH (ref 70–99)
Potassium: 3.2 mmol/L — ABNORMAL LOW (ref 3.5–5.1)
Sodium: 131 mmol/L — ABNORMAL LOW (ref 135–145)
Total Bilirubin: 1.5 mg/dL — ABNORMAL HIGH (ref 0.3–1.2)
Total Protein: 7.8 g/dL (ref 6.5–8.1)

## 2020-08-13 LAB — MAGNESIUM: Magnesium: 2.1 mg/dL (ref 1.7–2.4)

## 2020-08-13 LAB — GLUCOSE, CAPILLARY
Glucose-Capillary: 174 mg/dL — ABNORMAL HIGH (ref 70–99)
Glucose-Capillary: 159 mg/dL — ABNORMAL HIGH (ref 70–99)

## 2020-08-13 LAB — PHOSPHORUS: Phosphorus: 3.2 mg/dL (ref 2.5–4.6)

## 2020-08-13 LAB — MRSA PCR SCREENING: MRSA by PCR: NEGATIVE

## 2020-08-13 MED ORDER — LORAZEPAM 1 MG PO TABS: 1.0000 mg | ORAL_TABLET | ORAL | Status: DC | PRN

## 2020-08-13 MED ORDER — POTASSIUM CHLORIDE CRYS ER 20 MEQ PO TBCR: 40.0000 meq | EXTENDED_RELEASE_TABLET | Freq: Once | ORAL | Status: DC

## 2020-08-13 MED ORDER — LORAZEPAM 2 MG/ML IJ SOLN
1.0000 mg | INTRAMUSCULAR | Status: DC | PRN
  Administered 2020-08-13: 1 mg via INTRAVENOUS
  Filled 2020-08-13: qty 1

## 2020-08-13 MED ORDER — LORAZEPAM 2 MG/ML IJ SOLN
1.0000 mg | INTRAMUSCULAR | Status: DC | PRN
  Administered 2020-08-13: 3 mg via INTRAVENOUS
  Filled 2020-08-13: qty 2

## 2020-08-13 MED ORDER — CHLORHEXIDINE GLUCONATE CLOTH 2 % EX PADS
6.0000 | MEDICATED_PAD | Freq: Every day | CUTANEOUS | Status: DC
  Administered 2020-08-13: 6 via TOPICAL

## 2020-08-13 MED ORDER — THIAMINE HCL 100 MG/ML IJ SOLN
Freq: Once | INTRAVENOUS | Status: AC
  Filled 2020-08-13: qty 1000

## 2020-08-13 MED ORDER — DEXMEDETOMIDINE HCL IN NACL 400 MCG/100ML IV SOLN
0.2000 ug/kg/h | INTRAVENOUS | Status: DC
Start: 2020-08-13 — End: 2020-08-13
  Administered 2020-08-13: 0.2 ug/kg/h via INTRAVENOUS
  Administered 2020-08-13: 0.45 ug/kg/h via INTRAVENOUS
  Filled 2020-08-13: qty 100

## 2020-08-13 NOTE — Progress Notes (Signed)
ICU nursing reported - patient's daughter wanting to take him home AMA. I had long conversation with her in ED and mentioned that we can slowly try to wean his precedex as he tolerates. Later, daughter requested call when he was in ICU and requested to D/C as AMA per ICU nursing.

## 2020-08-13 NOTE — ED Notes (Signed)
Attempted to call report, nurse not assigned, per ICU secretary nurse will return my call for report. Charge nurse April aware.

## 2020-08-13 NOTE — Progress Notes (Addendum)
1545 After signing AMA paper,patients daughter Charlann Lange demanded a walker since EMS did not transport him with his walker( per daughter who was not there for transport}. Transition of care representative in the ED is providing the walker.1610 Patient and daughter left AMA.

## 2020-08-13 NOTE — ED Notes (Signed)
Attempted to call report

## 2020-08-13 NOTE — ED Notes (Signed)
Attempted to call report, informed by ICU staff that no nurse has been assigned for this patient on their unit and "Molly Maduro" would be coming down to ER to evaluate patient. Informed when nurse is assigned they will call for report. Charge nurse April notified.

## 2020-08-13 NOTE — ED Notes (Signed)
Report given to Myra, RN.

## 2020-08-13 NOTE — ED Notes (Signed)
Patient continues to try to get out of bed. Restless. Confused. Unable to redirect. CIWA 15.  Admitting provider Ouma NP notified. Awaiting orders.

## 2020-08-13 NOTE — ED Notes (Addendum)
Ouma NP notified via secure-message that patient has become increasingly confused/agitated.Given Ativan 3mg  IVP per CIWA protocol at 0340, no improvement in status. CIWA 29 at this time. Safety sitter at bedside. Unable to redirect patient. States "I am going to the store to get food, I am late for work, let me up". Explained to patient that he is in the hospital, patient aggravated and swinging at staff. Awaiting orders.

## 2020-08-13 NOTE — ED Notes (Signed)
Daughter at bedside and given update

## 2020-08-13 NOTE — Progress Notes (Signed)
1200 Resting quietly on .45 of Precedex IV.1400 Daughter has called multiple times to tell staff she was told by" the doctor "that he could go home".1430 Dr Sherryll Burger talked with daughter and daughter wants to sign him out AMA. Daughter called to come sit with patient if she insist on sedation being lowered.

## 2020-08-13 NOTE — ED Notes (Addendum)
Patient continues to try to get out of bed, safety sitter at bedside. Unable to redirect patient due to confusion. Agitated, restless. Reports he needs to go to work and asking staff to get his pants for him. Ouma NP notified next available dose of Ativan 1mg  IVP for anxiety is until 0600 as well as current CIWA protocol dose of Ativan 0-4mg  q6h. CIWA 17 at this time.Awaiting orders.

## 2020-08-14 LAB — CALCIUM, IONIZED: Calcium, Ionized, Serum: 4.9 mg/dL (ref 4.5–5.6)

## 2020-08-16 NOTE — Discharge Summary (Signed)
Patient left AMA from ICU/SD.

## 2020-08-20 LAB — METHYLMALONIC ACID, SERUM: Methylmalonic Acid, Quantitative: 1224 nmol/L — ABNORMAL HIGH (ref 0–378)

## 2020-09-04 LAB — BLOOD GAS, VENOUS
Acid-Base Excess: 3.1 mmol/L — ABNORMAL HIGH (ref 0.0–2.0)
Bicarbonate: 27 mmol/L (ref 20.0–28.0)
O2 Saturation: 92.7 %
Patient temperature: 37
pCO2, Ven: 38 mmHg — ABNORMAL LOW (ref 44.0–60.0)
pH, Ven: 7.46 — ABNORMAL HIGH (ref 7.250–7.430)
pO2, Ven: 62 mmHg — ABNORMAL HIGH (ref 32.0–45.0)

## 2021-10-25 IMAGING — CT CT HEAD W/O CM
3 of 4 series · 13 of 47 positions shown, 15 images · non-contrast
Comparison: March 04, 2018

CLINICAL DATA: Seizure

EXAM:
CT HEAD WITHOUT CONTRAST
TECHNIQUE: Contiguous axial images were obtained from the base of the skull
through the vertex without intravenous contrast.

[Series 3: ax head wo · axial · 0.31mm/px · z∈[-150,-41]mm · 7 of 30 slices shown, 9 images]
[im 4/30  brain]
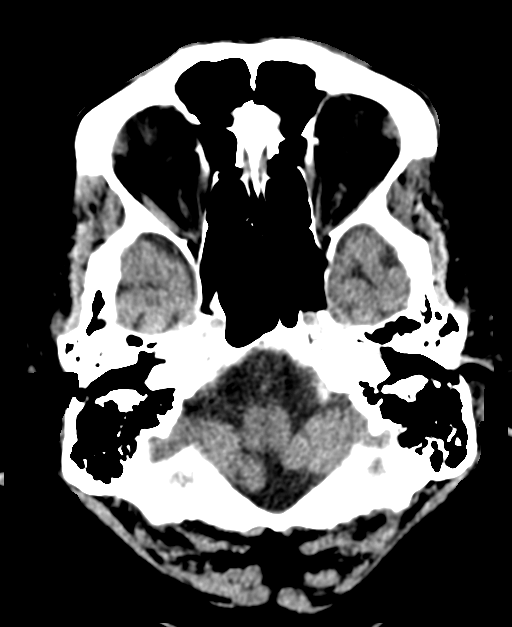
[im 4/30  bone]
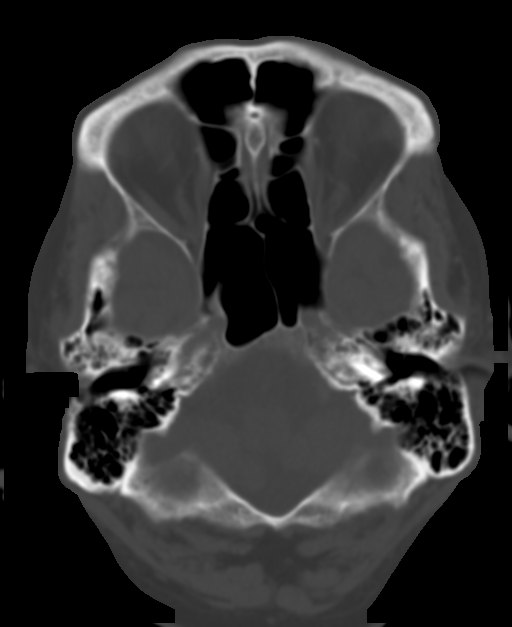
[im 8/30  brain]
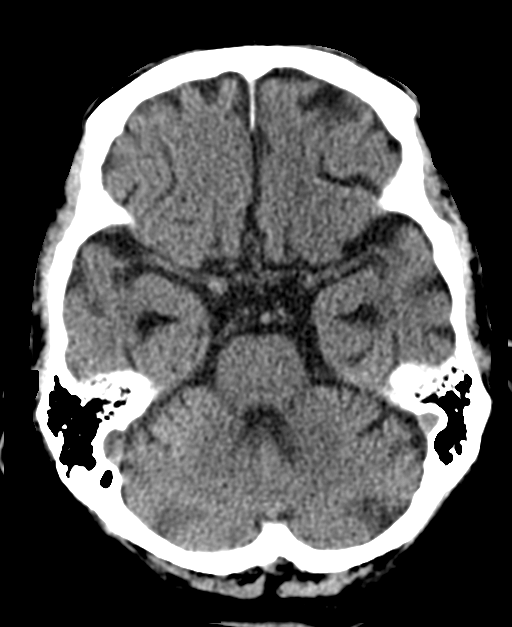
[im 11/30  brain]
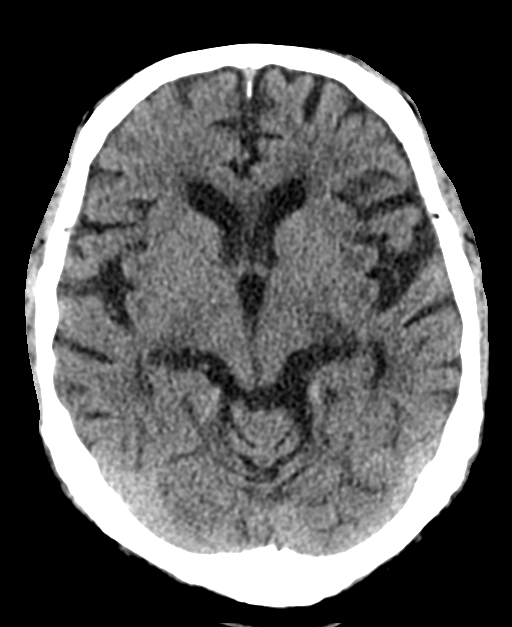
[im 15/30  brain]
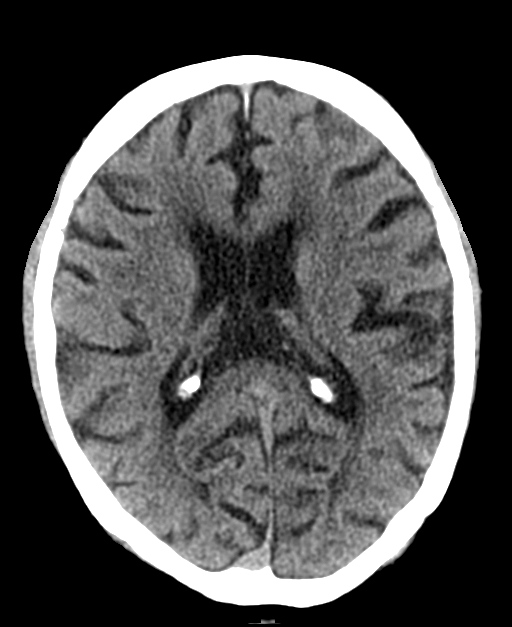
[im 19/30  brain]
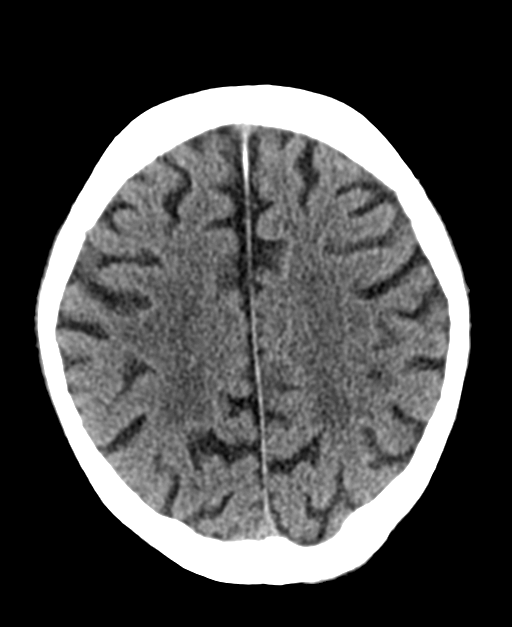
[im 19/30  bone]
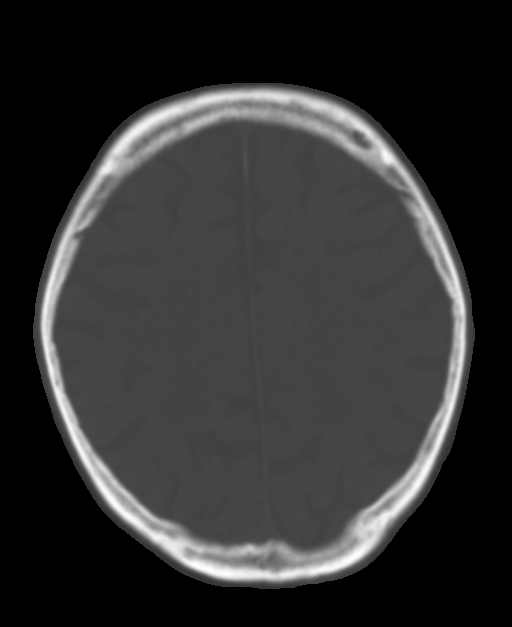
[im 22/30  brain]
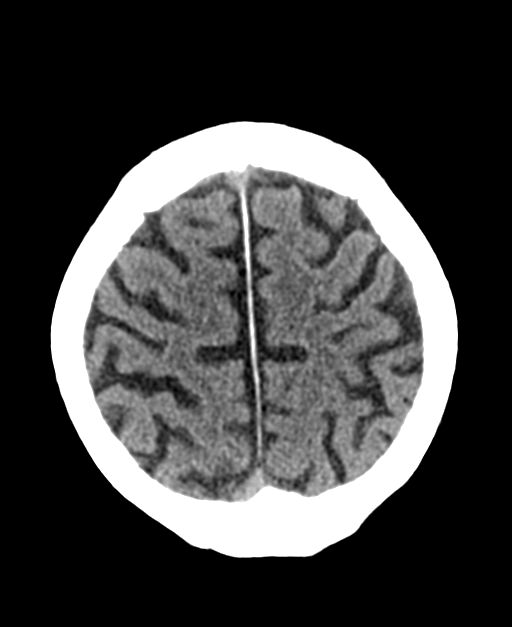
[im 26/30  brain]
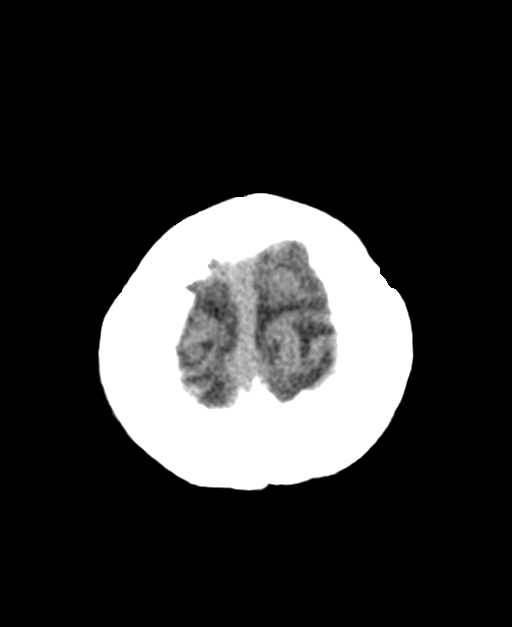

[Series 4: coronal soft tissue · coronal · 0.28mm/px · 3 of 66 slices shown]
[im 22/66  brain]
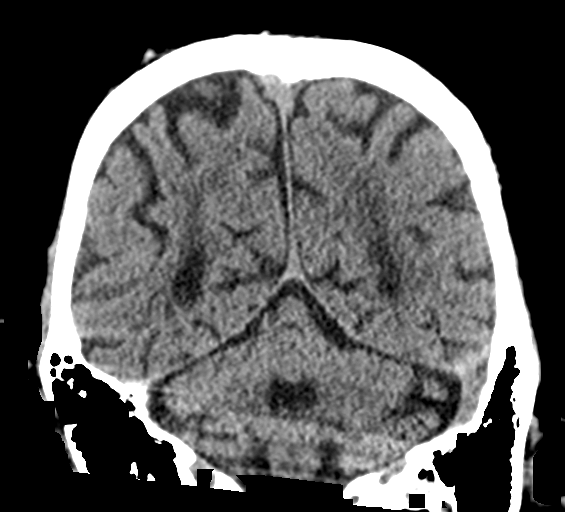
[im 29/66  brain]
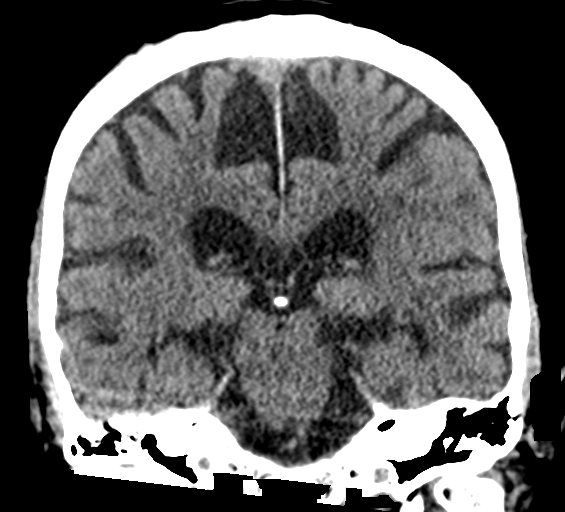
[im 37/66  brain]
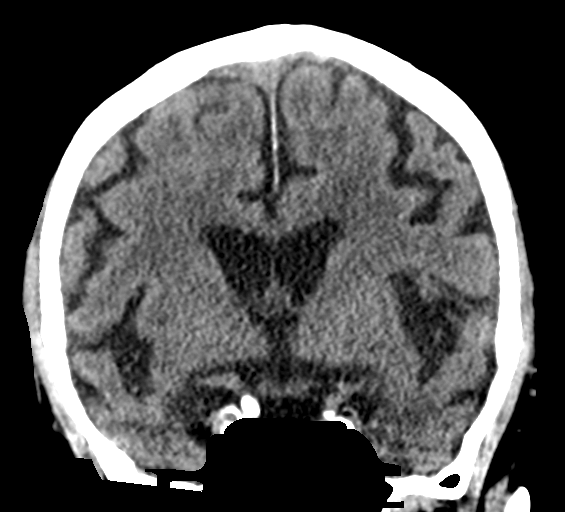

[Series 5: sagittal soft tissue · sagittal · 0.28mm/px · 3 of 54 slices shown]
[im 18/54  brain]
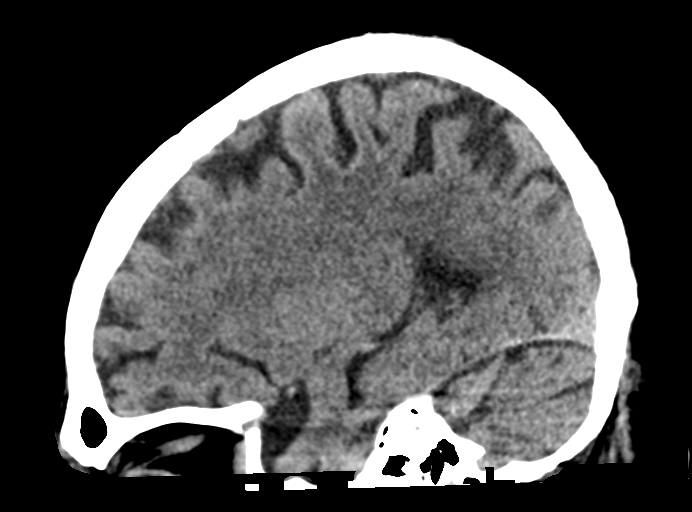
[im 27/54  brain]
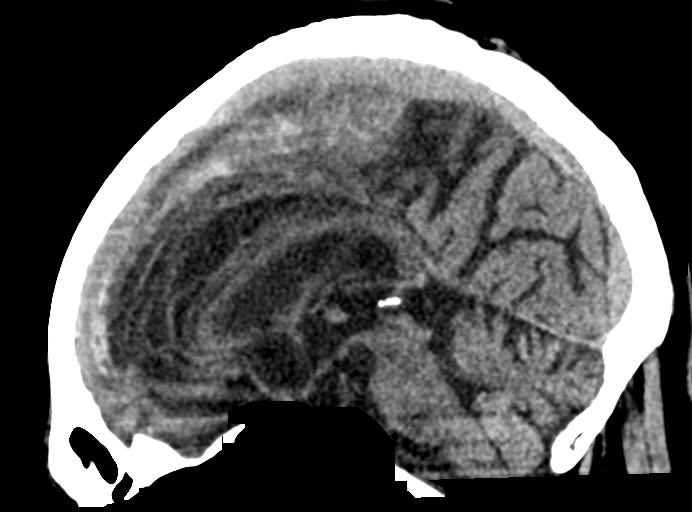
[im 36/54  brain]
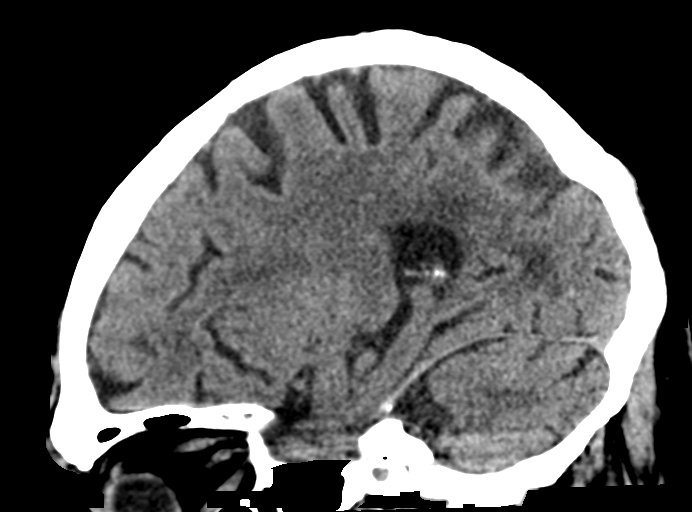

[13 of 47 positions shown; findings below may reference images not displayed]

FINDINGS: Brain: There is mild diffuse atrophy. There is no intracranial mass,
hemorrhage, extra-axial fluid collection, or midline shift. There is
mild patchy decreased attenuation in the centra semiovale
bilaterally, stable. No acute appearing infarct evident on this
study.

Vascular: No hyperdense vessel. There are foci of calcification in
each carotid siphon region.

Skull: Bony calvarium appears intact.

Sinuses/Orbits: There is mucosal thickening in several ethmoid air
cells. Other visualized paranasal sinuses are clear. Visualized
orbits appear symmetric bilaterally.

Other: Visualized mastoid air cells are clear.
IMPRESSION: Stable mild atrophy with patchy periventricular small vessel
disease, stable. No acute infarct. No mass or hemorrhage.

Foci of arterial vascular calcification noted. There is mucosal
thickening in several ethmoid air cells.

## 2021-10-25 IMAGING — DX DG CHEST 1V PORT
1 series · 1 of 1 positions shown · non-contrast
Comparison: March 04, 2018

CLINICAL DATA: Seizure

EXAM:
PORTABLE CHEST 1 VIEW

[chest ap]
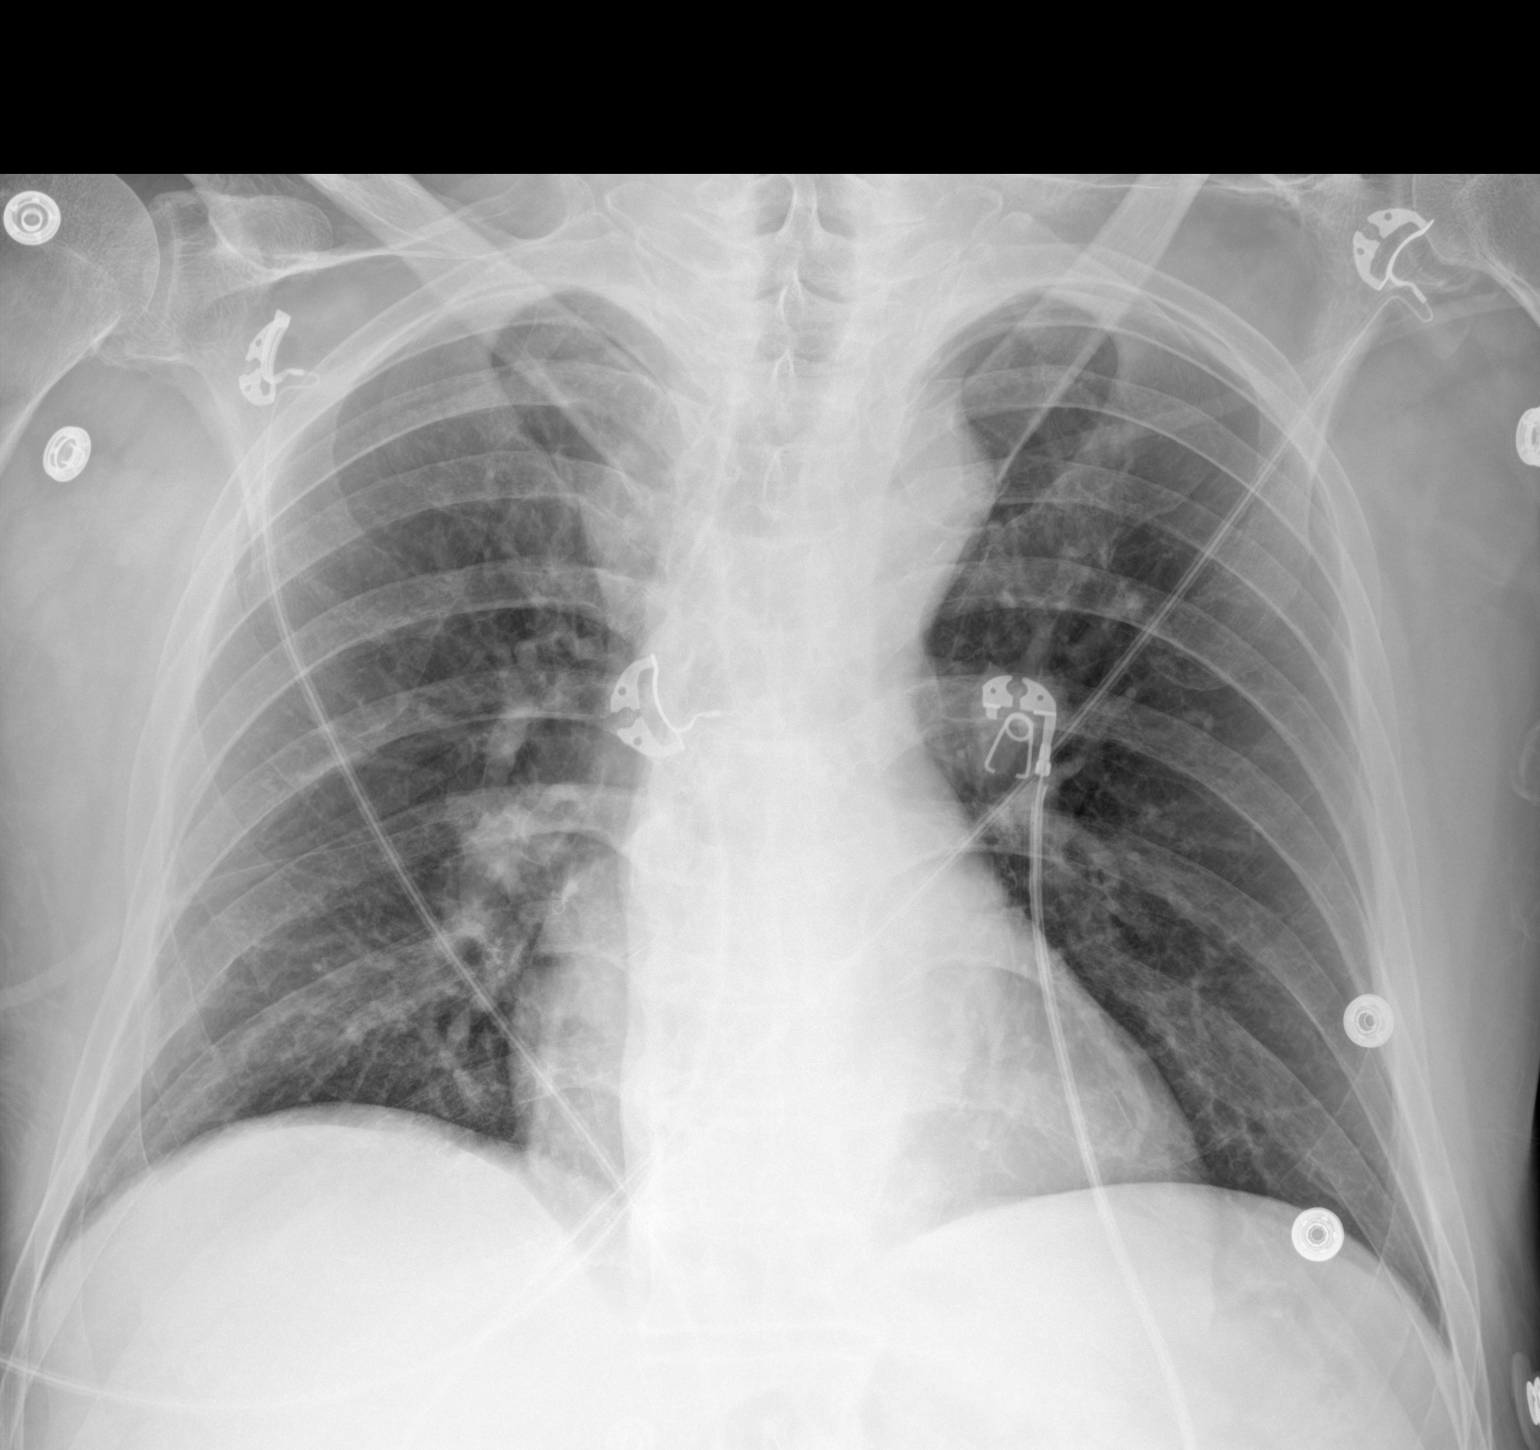

[1 of 1 positions shown; findings below may reference images not displayed]

FINDINGS: The lungs are clear. Heart size and pulmonary vascularity are within
normal limits. No adenopathy. No pneumothorax. No bone lesions.
IMPRESSION: No edema or consolidation.  Stable cardiac silhouette.
